# Patient Record
Sex: Female | Born: 1985 | Race: Black or African American | Hispanic: No | Marital: Single | Smoking: Never smoker
Health system: Southern US, Community
[De-identification: ages and names within clinical notes are randomized; demographics above are authoritative.]

## PROBLEM LIST (undated history)

## (undated) DIAGNOSIS — Z8759 Personal history of other complications of pregnancy, childbirth and the puerperium: Secondary | ICD-10-CM

## (undated) DIAGNOSIS — H547 Unspecified visual loss: Secondary | ICD-10-CM

## (undated) DIAGNOSIS — Z8632 Personal history of gestational diabetes: Secondary | ICD-10-CM

## (undated) DIAGNOSIS — T8859XA Other complications of anesthesia, initial encounter: Secondary | ICD-10-CM

## (undated) DIAGNOSIS — T4145XA Adverse effect of unspecified anesthetic, initial encounter: Secondary | ICD-10-CM

## (undated) DIAGNOSIS — D573 Sickle-cell trait: Secondary | ICD-10-CM

## (undated) HISTORY — DX: Sickle-cell trait: D57.3

## (undated) HISTORY — DX: Personal history of gestational diabetes: Z86.32

## (undated) HISTORY — DX: Unspecified visual loss: H54.7

## (undated) HISTORY — DX: Personal history of other complications of pregnancy, childbirth and the puerperium: Z87.59

---
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
## 2012-10-04 ENCOUNTER — Encounter (HOSPITAL_COMMUNITY): Payer: Self-pay | Admitting: Nurse Practitioner

## 2012-10-04 ENCOUNTER — Emergency Department (HOSPITAL_COMMUNITY)
Admission: EM | Admit: 2012-10-04 | Discharge: 2012-10-04 | Disposition: A | Discharge status: Home / Self Care | Payer: Worker's Compensation | Attending: Emergency Medicine | Admitting: Emergency Medicine

## 2012-10-04 ENCOUNTER — Emergency Department (HOSPITAL_COMMUNITY): Payer: Worker's Compensation

## 2012-10-04 DIAGNOSIS — W240XXA Contact with lifting devices, not elsewhere classified, initial encounter: Secondary | ICD-10-CM | POA: Insufficient documentation

## 2012-10-04 DIAGNOSIS — S92919A Unspecified fracture of unspecified toe(s), initial encounter for closed fracture: Secondary | ICD-10-CM | POA: Insufficient documentation

## 2012-10-04 DIAGNOSIS — Y9389 Activity, other specified: Secondary | ICD-10-CM | POA: Insufficient documentation

## 2012-10-04 DIAGNOSIS — Z8669 Personal history of other diseases of the nervous system and sense organs: Secondary | ICD-10-CM

## 2012-10-04 DIAGNOSIS — Y9289 Other specified places as the place of occurrence of the external cause: Secondary | ICD-10-CM | POA: Insufficient documentation

## 2012-10-04 DIAGNOSIS — Y990 Civilian activity done for income or pay: Secondary | ICD-10-CM | POA: Insufficient documentation

## 2012-10-04 DIAGNOSIS — S92911A Unspecified fracture of right toe(s), initial encounter for closed fracture: Secondary | ICD-10-CM

## 2012-10-04 MED ORDER — HYDROCODONE-ACETAMINOPHEN 5-325 MG PO TABS
1.0000 | ORAL_TABLET | Freq: Once | ORAL | Status: AC
  Administered 2012-10-04: 1 via ORAL
  Filled 2012-10-04 (×2): qty 1

## 2012-10-04 MED ORDER — HYDROCODONE-ACETAMINOPHEN 5-325 MG PO TABS: 1.0000 | ORAL_TABLET | ORAL | Status: DC | PRN

## 2012-10-04 NOTE — Progress Notes (Signed)
Orthopedic Tech Progress Note Patient Details:  Kim Brewer Dec 12, 1985 [id number]  Ortho Devices Type of Ortho Device: Crutches;Postop shoe/boot Ortho Device/Splint Location: RLE Ortho Device/Splint Interventions: Ordered;Application   Jennye Moccasin 10/04/2012, 6:34 PM

## 2012-10-04 NOTE — ED Notes (Signed)
Pt is awaiting UDS to be complete before being medicated per MAR.

## 2012-10-04 NOTE — ED Notes (Signed)
States a forklift ran over R foot today. States unable to wiggle toes. Pulses intact, skin w/d.

## 2012-10-04 NOTE — ED Notes (Signed)
Ortho paged for crutches and post op boot. 

## 2012-10-04 NOTE — ED Notes (Signed)
Pt denies any questions upon discharge. 

## 2012-10-04 NOTE — ED Provider Notes (Signed)
History    This chart was scribed for non-physician practitioner Elpidio Anis, PA-C working with Celene Kras, MD by Gerlean Ren, ED Scribe. This patient was seen in room TR06C/TR06C and the patient's care was started at 6:03 PM.   CSN: [id number]  Arrival date & time 10/04/12  1619   First MD Initiated Contact with Patient 10/04/12 1736      Chief Complaint  Patient presents with  . Foot Injury     The history is provided by the patient. No language interpreter was used.  Kim Brewer is a 27 y.o. female who presents to the Emergency Department complaining of constant pain over right foot with sudden onset after a forklift ran over the foot at work today.  Pt denies any numbness in the toes.  Pt denies any falls or further injuries as a result.   No known allergies.      History reviewed. No pertinent past medical history.  History reviewed. No pertinent past surgical history.  History reviewed. No pertinent family history.  History  Substance Use Topics  . Smoking status: Not on file  . Smokeless tobacco: Not on file  . Alcohol Use: Not on file    No OB history provided.   Review of Systems  Musculoskeletal:       Positive right foot pain  Skin: Negative for wound.    Allergies  Review of patient's allergies indicates no known allergies.  Home Medications   Current Outpatient Rx  Name  Route  Sig  Dispense  Refill  . Calcium Carbonate-Vitamin D (CALCIUM-VITAMIN D) 500-200 MG-UNIT per tablet   Oral   Take 1 tablet by mouth daily.         . Multiple Vitamin (MULTIVITAMIN WITH MINERALS) TABS   Oral   Take 1 tablet by mouth daily.           BP 145/96  Pulse 102  Temp(Src) 99 F (37.2 C) (Oral)  Resp 22  SpO2 98%  LMP 09/20/2012  Physical Exam  Nursing note and vitals reviewed. Constitutional: She is oriented to person, place, and time. She appears well-developed and well-nourished. No distress.  HENT:  Head: Normocephalic and atraumatic.   Eyes: EOM are normal.  Neck: Neck supple. No tracheal deviation present.  Cardiovascular: Normal rate.   Pulmonary/Chest: Effort normal. No respiratory distress.  Musculoskeletal: Normal range of motion.  Neurological: She is alert and oriented to person, place, and time.  Skin: Skin is warm and dry.  Psychiatric: She has a normal mood and affect. Her behavior is normal.    ED Course  Procedures (including critical care time) DIAGNOSTIC STUDIES: Oxygen Saturation is 98% on room air, normal by my interpretation.    COORDINATION OF CARE: 6:06 PM- Informed pt of fracture found in XR but that surgery is not needed.  Discussed post-op boot, ibuprofen at work, and stronger pain medicine to be used when safe.  Pt verbalizes understanding and agrees with plan.     Dg Foot Complete Right  10/04/2012  *RADIOLOGY REPORT*  Clinical Data: Crush injury, ran over by forklift  RIGHT FOOT COMPLETE - 3+ VIEW  Comparison: None.  Findings: New acute nondisplaced fractures through the tuft of the distal phalanx of the great toe and second toe.  No additional fractures or malalignment identified.  There is diffuse soft tissue swelling over the dorsum of the foot.  IMPRESSION:  Acute nondisplaced fractures of the tufts of the distal phalanges of the great and second  toes.   Original Report Authenticated By: Malachy Moan, M.D.      No diagnosis found.  1. Toe fractures 2. History of blindness   MDM  Discussed with family/friend/co-worker at bedside that this blind patient would need someone to help her with ambulation as long as she needed to use crutches as she would be unable to use her sight cane. Patient and friends acknowledge and understand.      I personally performed the services described in this documentation, which was scribed in my presence. The recorded information has been reviewed and is accurate.     Arnoldo Hooker, PA-C 10/04/12 1829

## 2012-10-04 NOTE — ED Provider Notes (Signed)
Medical screening examination/treatment/procedure(s) were performed by non-physician practitioner and as supervising physician I was immediately available for consultation/collaboration.   Ivionna Verley R Sakura Denis, MD 10/04/12 1837 

## 2012-10-04 NOTE — ED Notes (Signed)
Lab at bedside for testing

## 2013-03-01 ENCOUNTER — Ambulatory Visit (INDEPENDENT_AMBULATORY_CARE_PROVIDER_SITE_OTHER): Payer: BC Managed Care – PPO | Admitting: Emergency Medicine

## 2013-03-01 VITALS — BP 122/88 | HR 87 | Temp 99.6°F | Resp 36 | Ht 62.0 in | Wt 198.0 lb

## 2013-03-01 DIAGNOSIS — R079 Chest pain, unspecified: Secondary | ICD-10-CM

## 2013-03-01 DIAGNOSIS — K2970 Gastritis, unspecified, without bleeding: Secondary | ICD-10-CM

## 2013-03-01 MED ORDER — ESOMEPRAZOLE MAGNESIUM 40 MG PO CPDR: 40.0000 mg | DELAYED_RELEASE_CAPSULE | Freq: Every day | ORAL | Status: DC

## 2013-03-01 MED ORDER — GI COCKTAIL ~~LOC~~: 30.0000 mL | Freq: Once | ORAL | Status: DC

## 2013-03-01 MED ORDER — GI COCKTAIL ~~LOC~~
30.0000 mL | Freq: Once | ORAL | Status: AC
  Administered 2013-03-01: 30 mL via ORAL

## 2013-03-01 NOTE — Progress Notes (Signed)
[facility] [address] [place] [address] [phone]  Date:  03/01/2013   Name:  Kim Brewer   DOB:  1986-01-22   MRN:  [id number]  PCP:  No PCP Per Patient    Chief Complaint: Shortness of Breath and Abdominal Pain   History of Present Illness:  Kim Brewer is a 27 y.o. very pleasant female patient who presents with the following:  Thinks she is allergic to mushrooms.  Her sister made spaghetti with mushrooms.  While eating it she says her nose began to burn and then she had a pain in her epigastrium that was lancinating and then she developed a pressure like pain in her mid chest.  She denies nausea or vomiting.  No shortness of breath or wheezing or cough.  Has no food intolerance usually.  Has no symptoms suggestive of reflux.  No fever or chills.  No nausea or vomiting.  No stool change.  Pain not radiating.  Non smoker,  No medical problems.  Denies other complaint or health concern today.   There are no active problems to display for this patient.   Past Medical History  Diagnosis Date  . Blind     No past surgical history on file.  History  Substance Use Topics  . Smoking status: Never Smoker   . Smokeless tobacco: Not on file  . Alcohol Use: Not on file    No family history on file.  No Known Allergies  Medication list has been reviewed and updated.  Current Outpatient Prescriptions on File Prior to Visit  Medication Sig Dispense Refill  . Calcium Carbonate-Vitamin D (CALCIUM-VITAMIN D) 500-200 MG-UNIT per tablet Take 1 tablet by mouth daily.      . Multiple Vitamin (MULTIVITAMIN WITH MINERALS) TABS Take 1 tablet by mouth daily.       No current facility-administered medications on file prior to visit.    Review of Systems:  As per HPI, otherwise negative.    Physical Examination: Filed Vitals:   03/01/13 1447  BP: 122/88  Pulse: 87  Temp: 99.6 F (37.6 C)  Resp: 36   Filed Vitals:   03/01/13 1447  Height: 5'  2" (1.575 m)  Weight: 198 lb (89.812 kg)   Body mass index is 36.21 kg/(m^2). Ideal Body Weight: Weight in (lb) to have BMI = 25: 136.4  GEN: WDWN, NAD, Non-toxic, A & O x 3 HEENT: Atraumatic, Normocephalic. Neck supple. No masses, No LAD. Ears and Nose: No external deformity. CV: RRR, No M/G/R. No JVD. No thrill. No extra heart sounds. PULM: CTA B, no wheezes, crackles, rhonchi. No retractions. No resp. distress. No accessory muscle use. ABD: S, NT, ND, +BS. No rebound. No HSM. EXTR: No c/c/e NEURO Normal gait.  PSYCH: Normally interactive. Conversant. Not depressed or anxious appearing.  Calm demeanor.    Assessment and Plan: Gastritis Chest pain nexium   Signed,  Phillips Odor, MD

## 2013-03-01 NOTE — Patient Instructions (Signed)

## 2013-04-28 ENCOUNTER — Other Ambulatory Visit: Payer: Self-pay | Admitting: Occupational Medicine

## 2013-04-28 ENCOUNTER — Ambulatory Visit: Payer: Self-pay

## 2013-04-28 DIAGNOSIS — R52 Pain, unspecified: Secondary | ICD-10-CM

## 2013-05-19 ENCOUNTER — Ambulatory Visit (INDEPENDENT_AMBULATORY_CARE_PROVIDER_SITE_OTHER): Payer: BC Managed Care – PPO | Admitting: Family Medicine

## 2013-05-19 VITALS — BP 110/78 | HR 72 | Temp 98.0°F | Resp 16 | Ht 63.0 in | Wt 200.0 lb

## 2013-05-19 DIAGNOSIS — H6120 Impacted cerumen, unspecified ear: Secondary | ICD-10-CM

## 2013-05-19 DIAGNOSIS — H6123 Impacted cerumen, bilateral: Secondary | ICD-10-CM

## 2013-05-19 DIAGNOSIS — H543 Unqualified visual loss, both eyes: Secondary | ICD-10-CM

## 2013-05-19 DIAGNOSIS — H6993 Unspecified Eustachian tube disorder, bilateral: Secondary | ICD-10-CM

## 2013-05-19 DIAGNOSIS — H9201 Otalgia, right ear: Secondary | ICD-10-CM

## 2013-05-19 DIAGNOSIS — H6191 Disorder of right external ear, unspecified: Secondary | ICD-10-CM

## 2013-05-19 DIAGNOSIS — H9209 Otalgia, unspecified ear: Secondary | ICD-10-CM

## 2013-05-19 DIAGNOSIS — H6983 Other specified disorders of Eustachian tube, bilateral: Secondary | ICD-10-CM

## 2013-05-19 DIAGNOSIS — H6190 Disorder of external ear, unspecified, unspecified ear: Secondary | ICD-10-CM

## 2013-05-19 MED ORDER — TRAMADOL HCL 50 MG PO TABS: 50.0000 mg | ORAL_TABLET | Freq: Three times a day (TID) | ORAL | Status: DC | PRN

## 2013-05-19 MED ORDER — DOXYCYCLINE HYCLATE 100 MG PO CAPS: 100.0000 mg | ORAL_CAPSULE | Freq: Two times a day (BID) | ORAL | Status: DC

## 2013-05-19 MED ORDER — FLUTICASONE PROPIONATE 50 MCG/ACT NA SUSP: NASAL | Status: DC

## 2013-05-19 NOTE — Patient Instructions (Addendum)
Take doxycycline one twice daily for infection of the lesion in the ear  Referral will be made for an ear nose and throat doctor to take the place out of your ear.  Takes the tramadol one every 6 or 8 hours if needed for severe pain  Use the nose spray, fluticasone, 2 sprays in each nostril twice daily for 4 days, then once daily

## 2013-05-19 NOTE — Progress Notes (Signed)
Subjective

## 2016-06-15 NOTE — L&D Delivery Note (Signed)
Delivery Note At 12:54 PM a viable female was delivered via Vaginal, Spontaneous (Presentation: OA with compound right arm).  APGAR: 8, 9; weight  pending.  Placenta status: delivered intact via schultz.  Cord: 3V  with the following complications: PPH.  Cord pH: n/a  Brisk bleeding after delivery of placenta. Boggy LUS. No improvement with fundal massage. Pitocin infusing. Cytotec 800 mcg pr given and bleeding resolved.   Anesthesia: epidural and local   Episiotomy: None Lacerations: 2nd degree, right labial Suture Repair: 2.0 3.0 vicryl rapide Est. Blood Loss (mL): 1050  Mom to postpartum.  Baby to Couplet care / Skin to Skin.  Donette LarryMelanie Tyresha Fede, CNM 06/12/2017, 1:38 PM

## 2017-01-05 ENCOUNTER — Encounter: Payer: Self-pay | Admitting: Obstetrics and Gynecology

## 2017-01-06 ENCOUNTER — Encounter: Payer: Self-pay | Admitting: Certified Nurse Midwife

## 2017-01-06 ENCOUNTER — Ambulatory Visit (INDEPENDENT_AMBULATORY_CARE_PROVIDER_SITE_OTHER): Payer: BLUE CROSS/BLUE SHIELD | Admitting: Certified Nurse Midwife

## 2017-01-06 ENCOUNTER — Encounter: Payer: Self-pay | Admitting: *Deleted

## 2017-01-06 VITALS — BP 128/86 | HR 85 | Wt 188.0 lb

## 2017-01-06 DIAGNOSIS — O0990 Supervision of high risk pregnancy, unspecified, unspecified trimester: Secondary | ICD-10-CM | POA: Insufficient documentation

## 2017-01-06 DIAGNOSIS — Z124 Encounter for screening for malignant neoplasm of cervix: Secondary | ICD-10-CM | POA: Diagnosis not present

## 2017-01-06 DIAGNOSIS — Z3402 Encounter for supervision of normal first pregnancy, second trimester: Secondary | ICD-10-CM

## 2017-01-06 DIAGNOSIS — Z113 Encounter for screening for infections with a predominantly sexual mode of transmission: Secondary | ICD-10-CM | POA: Diagnosis not present

## 2017-01-06 DIAGNOSIS — D573 Sickle-cell trait: Secondary | ICD-10-CM

## 2017-01-06 DIAGNOSIS — Z3400 Encounter for supervision of normal first pregnancy, unspecified trimester: Secondary | ICD-10-CM

## 2017-01-06 NOTE — Progress Notes (Signed)
Pt states she is having some pelvic pressure.  Pt states she has had Migraines early in pregnancy.
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
## 2017-01-08 ENCOUNTER — Other Ambulatory Visit: Payer: Self-pay | Admitting: Certified Nurse Midwife

## 2017-01-08 LAB — PRENATAL PROFILE I(LABCORP)
ANTIBODY SCREEN: NEGATIVE
BASOS: 0 %
Basophils Absolute: 0 10*3/uL (ref 0.0–0.2)
EOS (ABSOLUTE): 0.3 10*3/uL (ref 0.0–0.4)
Eos: 4 %
HEMATOCRIT: 36.2 % (ref 34.0–46.6)
Hemoglobin: 12 g/dL (ref 11.1–15.9)
Hepatitis B Surface Ag: NEGATIVE
Immature Grans (Abs): 0 10*3/uL (ref 0.0–0.1)
Immature Granulocytes: 1 %
LYMPHS ABS: 1.9 10*3/uL (ref 0.7–3.1)
Lymphs: 24 %
MCH: 27.4 pg (ref 26.6–33.0)
MCHC: 33.1 g/dL (ref 31.5–35.7)
MCV: 83 fL (ref 79–97)
MONOS ABS: 0.4 10*3/uL (ref 0.1–0.9)
Monocytes: 5 %
NEUTROS ABS: 5.2 10*3/uL (ref 1.4–7.0)
Neutrophils: 66 %
Platelets: 267 10*3/uL (ref 150–379)
RBC: 4.38 x10E6/uL (ref 3.77–5.28)
RDW: 13.6 % (ref 12.3–15.4)
RPR Ser Ql: NONREACTIVE
RUBELLA: 1.71 {index} (ref >0.99)
Rh Factor: POSITIVE
WBC: 7.8 10*3/uL (ref 3.4–10.8)

## 2017-01-08 LAB — HEMOGLOBINOPATHY EVALUATION
HEMOGLOBIN A2 QUANTITATION: 3.8 % — AB (ref 1.8–3.2)
HEMOGLOBIN F QUANTITATION: 0 % (ref 0.0–2.0)
HGB C: 0 %
HGB S: 33.7 % — AB
HGB VARIANT: 0 %
Hgb A: 62.5 % — ABNORMAL LOW (ref 96.4–98.8)

## 2017-01-08 LAB — CYTOLOGY - PAP: DIAGNOSIS: NEGATIVE

## 2017-01-08 LAB — CERVICOVAGINAL ANCILLARY ONLY
Bacterial vaginitis: POSITIVE — AB
Candida vaginitis: NEGATIVE
Chlamydia: NEGATIVE
Neisseria Gonorrhea: NEGATIVE
TRICH (WINDOWPATH): NEGATIVE

## 2017-01-08 LAB — VARICELLA ZOSTER ANTIBODY, IGG: Varicella zoster IgG: 948 index (ref >165)

## 2017-01-08 LAB — HEMOGLOBIN A1C
Est. average glucose Bld gHb Est-mCnc: 103 mg/dL
Hgb A1c MFr Bld: 5.2 % (ref 4.8–5.6)

## 2017-01-08 LAB — VITAMIN D 25 HYDROXY (VIT D DEFICIENCY, FRACTURES): Vit D, 25-Hydroxy: 38.9 ng/mL (ref 30.0–100.0)

## 2017-01-11 ENCOUNTER — Telehealth: Payer: Self-pay

## 2017-01-11 DIAGNOSIS — B9689 Other specified bacterial agents as the cause of diseases classified elsewhere: Secondary | ICD-10-CM

## 2017-01-11 DIAGNOSIS — N760 Acute vaginitis: Principal | ICD-10-CM

## 2017-01-11 MED ORDER — METRONIDAZOLE 500 MG PO TABS: 500.0000 mg | ORAL_TABLET | Freq: Two times a day (BID) | ORAL | 0 refills | Status: AC

## 2017-01-11 NOTE — Telephone Encounter (Signed)
Pt called wanting to know lab results, and blood type. Labs reviewed with patient. Rx has been sent to the pharmacy

## 2017-01-12 ENCOUNTER — Other Ambulatory Visit: Payer: Self-pay | Admitting: Certified Nurse Midwife

## 2017-01-12 DIAGNOSIS — Z3400 Encounter for supervision of normal first pregnancy, unspecified trimester: Secondary | ICD-10-CM

## 2017-02-04 ENCOUNTER — Other Ambulatory Visit: Payer: Self-pay | Admitting: Certified Nurse Midwife

## 2017-02-04 ENCOUNTER — Ambulatory Visit (INDEPENDENT_AMBULATORY_CARE_PROVIDER_SITE_OTHER): Payer: BLUE CROSS/BLUE SHIELD | Admitting: Certified Nurse Midwife

## 2017-02-04 ENCOUNTER — Ambulatory Visit (HOSPITAL_COMMUNITY)
Admission: RE | Admit: 2017-02-04 | Discharge: 2017-02-04 | Disposition: A | Discharge status: Home / Self Care | Payer: BLUE CROSS/BLUE SHIELD | Source: Ambulatory Visit | Attending: Certified Nurse Midwife | Admitting: Certified Nurse Midwife

## 2017-02-04 VITALS — BP 129/80 | HR 80 | Wt 189.2 lb

## 2017-02-04 DIAGNOSIS — Z363 Encounter for antenatal screening for malformations: Secondary | ICD-10-CM | POA: Insufficient documentation

## 2017-02-04 DIAGNOSIS — Z3400 Encounter for supervision of normal first pregnancy, unspecified trimester: Secondary | ICD-10-CM

## 2017-02-04 DIAGNOSIS — Z3A19 19 weeks gestation of pregnancy: Secondary | ICD-10-CM | POA: Insufficient documentation

## 2017-02-04 DIAGNOSIS — Z3402 Encounter for supervision of normal first pregnancy, second trimester: Secondary | ICD-10-CM

## 2017-02-04 DIAGNOSIS — Z62810 Personal history of physical and sexual abuse in childhood: Secondary | ICD-10-CM

## 2017-02-04 DIAGNOSIS — O99212 Obesity complicating pregnancy, second trimester: Secondary | ICD-10-CM | POA: Insufficient documentation
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
## 2017-02-04 NOTE — Progress Notes (Signed)
Patient reports good fetal movement, denies pain. 

## 2017-02-06 LAB — AFP TETRA
DIA Mom Value: 1.63
DIA Value (EIA): 252.36 pg/mL
DSR (BY AGE) 1 IN: 757
DSR (SECOND TRIMESTER) 1 IN: 10000
GESTATIONAL AGE AFP: 19 wk
MSAFP Mom: 1.8
MSAFP: 83.6 ng/mL
MSHCG Mom: 1.35
MSHCG: 29102 m[IU]/mL
Maternal Age At EDD: 29.3 yr
Osb Risk: 2555
TEST RESULTS AFP: NEGATIVE
WEIGHT: 189 [lb_av]
uE3 Mom: 1.68
uE3 Value: 2.42 ng/mL

## 2017-02-09 ENCOUNTER — Other Ambulatory Visit: Payer: Self-pay | Admitting: Certified Nurse Midwife

## 2017-02-09 DIAGNOSIS — Z3400 Encounter for supervision of normal first pregnancy, unspecified trimester: Secondary | ICD-10-CM

## 2017-03-04 ENCOUNTER — Encounter: Payer: Self-pay | Admitting: Certified Nurse Midwife

## 2017-03-04 ENCOUNTER — Ambulatory Visit (INDEPENDENT_AMBULATORY_CARE_PROVIDER_SITE_OTHER): Payer: BLUE CROSS/BLUE SHIELD | Admitting: Certified Nurse Midwife

## 2017-03-04 VITALS — BP 129/79 | HR 77 | Wt 194.7 lb

## 2017-03-04 DIAGNOSIS — Z62810 Personal history of physical and sexual abuse in childhood: Secondary | ICD-10-CM

## 2017-03-04 DIAGNOSIS — Z3402 Encounter for supervision of normal first pregnancy, second trimester: Secondary | ICD-10-CM

## 2017-03-04 DIAGNOSIS — H543 Unqualified visual loss, both eyes: Secondary | ICD-10-CM

## 2017-03-04 DIAGNOSIS — Z3400 Encounter for supervision of normal first pregnancy, unspecified trimester: Secondary | ICD-10-CM
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
## 2017-03-04 NOTE — Progress Notes (Signed)
Patient is unable to sleep.

## 2017-03-22 ENCOUNTER — Ambulatory Visit (HOSPITAL_COMMUNITY)
Admission: RE | Admit: 2017-03-22 | Discharge: 2017-03-22 | Disposition: A | Discharge status: Home / Self Care | Payer: BLUE CROSS/BLUE SHIELD | Source: Ambulatory Visit | Attending: Certified Nurse Midwife | Admitting: Certified Nurse Midwife

## 2017-03-22 DIAGNOSIS — Z3A25 25 weeks gestation of pregnancy: Secondary | ICD-10-CM | POA: Insufficient documentation

## 2017-03-22 DIAGNOSIS — Z362 Encounter for other antenatal screening follow-up: Secondary | ICD-10-CM | POA: Insufficient documentation

## 2017-03-22 DIAGNOSIS — O99212 Obesity complicating pregnancy, second trimester: Secondary | ICD-10-CM | POA: Insufficient documentation

## 2017-03-22 DIAGNOSIS — Z3400 Encounter for supervision of normal first pregnancy, unspecified trimester: Secondary | ICD-10-CM

## 2017-03-23 ENCOUNTER — Other Ambulatory Visit: Payer: Self-pay | Admitting: Certified Nurse Midwife

## 2017-03-23 DIAGNOSIS — Z3400 Encounter for supervision of normal first pregnancy, unspecified trimester: Secondary | ICD-10-CM

## 2017-04-01 ENCOUNTER — Encounter: Payer: Self-pay | Admitting: Certified Nurse Midwife

## 2017-04-01 ENCOUNTER — Other Ambulatory Visit: Payer: BLUE CROSS/BLUE SHIELD

## 2017-04-01 ENCOUNTER — Ambulatory Visit (INDEPENDENT_AMBULATORY_CARE_PROVIDER_SITE_OTHER): Payer: BLUE CROSS/BLUE SHIELD | Admitting: Certified Nurse Midwife

## 2017-04-01 VITALS — BP 132/82 | HR 92 | Wt 194.0 lb

## 2017-04-01 DIAGNOSIS — H543 Unqualified visual loss, both eyes: Secondary | ICD-10-CM

## 2017-04-01 DIAGNOSIS — Z3402 Encounter for supervision of normal first pregnancy, second trimester: Secondary | ICD-10-CM

## 2017-04-01 DIAGNOSIS — Z3400 Encounter for supervision of normal first pregnancy, unspecified trimester: Secondary | ICD-10-CM

## 2017-04-01 NOTE — Progress Notes (Signed)
Patient reports good fetal movement and reports often having a lot of pressure when she stands making it hard for her to walk.

## 2017-04-01 NOTE — Patient Instructions (Signed)
AREA PEDIATRIC/FAMILY PRACTICE PHYSICIANS  [facility] [address] [place], [place]  [address] Phone - [phone]   Fax - [phone]  [facility] [address] [place], [place] [address] Phone - [phone]   Fax - [phone]  [facility] [address] [place], [place]  [address] Phone - [phone]   Fax - [phone]  [facility] [address] [place], [place]  [address] Phone - [phone]   Fax - [phone]  [facility] [address] [place], [place]  [address] Phone - [phone]   Fax - [phone]  [facility] [address] [place], [place]  [address] Phone - [phone]   Fax - [phone]  [facility] [address] [place], [place]  [address] Phone - [phone]   Fax - [phone]  [facility] [address] [place], [place]  [address] Phone - [phone]   Fax - [phone]  [facility] [address] [place], [place]  [address] Phone - [phone]   Fax - [phone] [facility] [address] [place], [place]  [address] Phone - [phone]   Fax - [phone]  [facility] [address] [place], [place]  [address] Phone - [phone]   Fax - [phone]  [facility] [address] [place], [place]  [address] Phone - [phone]   Fax - [phone]  [facility] [address], [address] [place], [place]  [address] Phone - [phone]   Fax - [phone]  [facility] [address] [place], [place]  [address] Phone - [phone]  [facility] [address] [place], [place]  [address] Phone - [phone]   Fax - [phone]  [facility] [address] [place], [place]  [address] Phone - [phone]   Fax - [phone]  [facility] [address] [place], [place]  [address] Phone - [phone]   Fax - [phone]  [facility] [address] [place], [place]  [address] Phone - [phone]   Fax - [phone]   [facility] [address] [place], [place]  [address] Phone - [phone]   Fax - [phone]  [facility] [address] [place], [place]  [address] Phone - [phone]   Fax - [phone]  [facility] [address] [place], [place]  [address] Phone - [phone]   Fax - [phone]  [facility] [address] [place], [place]  [address] Phone - [phone]   Fax - [phone]  [facility] [address] , [place]  [address] Phone - [phone]   Fax - [phone] [facility] [address] [place], [place]  [address] Phone - [phone]   Fax - [phone]  [facility] [address] [place], [place]  [address] Phone - [phone]   Fax - [phone]  [facility] [address] ,   [address] Phone - [phone]   Fax - [phone]  [facility] Charlene Flemming MD [address] [place]  [address] Phone [phone]  Fax [phone]   
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
## 2017-04-02 ENCOUNTER — Telehealth: Payer: Self-pay

## 2017-04-02 ENCOUNTER — Other Ambulatory Visit: Payer: Self-pay | Admitting: Certified Nurse Midwife

## 2017-04-02 ENCOUNTER — Other Ambulatory Visit: Payer: Self-pay

## 2017-04-02 DIAGNOSIS — Z8632 Personal history of gestational diabetes: Secondary | ICD-10-CM | POA: Insufficient documentation

## 2017-04-02 DIAGNOSIS — O0990 Supervision of high risk pregnancy, unspecified, unspecified trimester: Secondary | ICD-10-CM

## 2017-04-02 DIAGNOSIS — O24410 Gestational diabetes mellitus in pregnancy, diet controlled: Secondary | ICD-10-CM

## 2017-04-02 HISTORY — DX: Personal history of gestational diabetes: Z86.32

## 2017-04-02 LAB — CBC
Hematocrit: 35.6 % (ref 34.0–46.6)
Hemoglobin: 11.5 g/dL (ref 11.1–15.9)
MCH: 27.4 pg (ref 26.6–33.0)
MCHC: 32.3 g/dL (ref 31.5–35.7)
MCV: 85 fL (ref 79–97)
PLATELETS: 245 10*3/uL (ref 150–379)
RBC: 4.19 x10E6/uL (ref 3.77–5.28)
RDW: 13.9 % (ref 12.3–15.4)
WBC: 9.2 10*3/uL (ref 3.4–10.8)

## 2017-04-02 LAB — GLUCOSE TOLERANCE, 2 HOURS W/ 1HR
GLUCOSE, FASTING: 85 mg/dL (ref 65–91)
Glucose, 1 hour: 177 mg/dL (ref 65–179)
Glucose, 2 hour: 204 mg/dL — ABNORMAL HIGH (ref 65–152)

## 2017-04-02 LAB — RPR: RPR: NONREACTIVE

## 2017-04-02 LAB — HIV ANTIBODY (ROUTINE TESTING W REFLEX): HIV Screen 4th Generation wRfx: NONREACTIVE

## 2017-04-02 MED ORDER — GLUCOSE BLOOD VI STRP: ORAL_STRIP | 12 refills | Status: DC

## 2017-04-02 MED ORDER — ACCU-CHEK GUIDE W/DEVICE KIT: 1.0000 | PACK | Freq: Once | 0 refills | Status: AC

## 2017-04-02 MED ORDER — ACCU-CHEK FASTCLIX LANCETS MISC
1.0000 | Freq: Four times a day (QID) | 12 refills | Status: DC
Start: 2017-04-02 — End: 2017-04-02

## 2017-04-02 MED ORDER — ACCU-CHEK GUIDE W/DEVICE KIT: 1.0000 | PACK | Freq: Once | 0 refills | Status: DC

## 2017-04-02 MED ORDER — ACCU-CHEK FASTCLIX LANCETS MISC: 1.0000 | Freq: Four times a day (QID) | 12 refills | Status: AC

## 2017-04-02 NOTE — Telephone Encounter (Signed)
Patient notified of results and Rx. 

## 2017-04-02 NOTE — Telephone Encounter (Signed)
-----   Message from Roe Coombsachelle A Denney, CNM sent at 04/02/2017 10:49 AM EDT ----- Please let her know that she did not pass her glucose testing.  Please order her glucometer, test strips and lancets.  MFM referral orders have been placed.  Thank you.  R.Denney CNM

## 2017-04-08 ENCOUNTER — Telehealth: Payer: Self-pay

## 2017-04-08 NOTE — Telephone Encounter (Signed)
Informed pt [employer] will not cover [employer]. The one device they will cover is Contour. Pt is visually disabled and is requesting a voice activated monitor. Pt directed to [employer] for voice activated options. Pt states she is on [employer] as we speak. Reminded pt of upcoming N&D visit on Weds. Pt agrees and has no further questions.

## 2017-04-14 ENCOUNTER — Ambulatory Visit: Payer: Self-pay

## 2017-04-14 ENCOUNTER — Encounter: Payer: BLUE CROSS/BLUE SHIELD | Attending: Certified Nurse Midwife | Admitting: Registered"

## 2017-04-14 DIAGNOSIS — Z713 Dietary counseling and surveillance: Secondary | ICD-10-CM | POA: Insufficient documentation

## 2017-04-14 DIAGNOSIS — O24410 Gestational diabetes mellitus in pregnancy, diet controlled: Secondary | ICD-10-CM | POA: Insufficient documentation

## 2017-04-14 DIAGNOSIS — O0993 Supervision of high risk pregnancy, unspecified, third trimester: Secondary | ICD-10-CM | POA: Diagnosis not present

## 2017-04-14 DIAGNOSIS — R7309 Other abnormal glucose: Secondary | ICD-10-CM

## 2017-04-14 NOTE — Progress Notes (Signed)
Patient was seen on 04/14/17 for Gestational Diabetes self-management class at the [facility]. The following learning objectives were met by the patient during this course:   States the definition of Gestational Diabetes  States why dietary management is important in controlling blood glucose  Describes the effects each nutrient has on blood glucose levels  Demonstrates ability to create a balanced meal plan  Demonstrates carbohydrate counting   States when to check blood glucose levels  Demonstrates proper blood glucose monitoring techniques  States the effect of stress and exercise on blood glucose levels  States the importance of limiting caffeine and abstaining from alcohol and smoking  Blood glucose monitor given: [employer] Lot # [id number] X Exp: 07/15/2018 Blood glucose reading: 110  Patient instructed to monitor glucose levels: FBS: 60 - <95 1 hour: <140 2 hour: <120  Patient received handouts:  Nutrition Diabetes and Pregnancy  Carbohydrate Counting List  Patient will be seen for follow-up as needed.

## 2017-04-15 ENCOUNTER — Encounter: Payer: Self-pay | Admitting: Registered"

## 2017-04-15 ENCOUNTER — Ambulatory Visit (INDEPENDENT_AMBULATORY_CARE_PROVIDER_SITE_OTHER): Payer: BLUE CROSS/BLUE SHIELD | Admitting: Certified Nurse Midwife

## 2017-04-15 VITALS — BP 116/78 | HR 83 | Wt 194.6 lb

## 2017-04-15 DIAGNOSIS — O0990 Supervision of high risk pregnancy, unspecified, unspecified trimester: Secondary | ICD-10-CM

## 2017-04-15 DIAGNOSIS — O24410 Gestational diabetes mellitus in pregnancy, diet controlled: Secondary | ICD-10-CM

## 2017-04-15 DIAGNOSIS — Z62810 Personal history of physical and sexual abuse in childhood: Secondary | ICD-10-CM

## 2017-04-15 DIAGNOSIS — H543 Unqualified visual loss, both eyes: Secondary | ICD-10-CM
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
## 2017-04-19 ENCOUNTER — Ambulatory Visit (INDEPENDENT_AMBULATORY_CARE_PROVIDER_SITE_OTHER): Payer: Self-pay | Admitting: Pediatrics

## 2017-04-19 DIAGNOSIS — Z7681 Expectant parent(s) prebirth pediatrician visit: Secondary | ICD-10-CM

## 2017-04-24 NOTE — Progress Notes (Signed)
Prenatal counseling for impending newborn done-- 1st boy, currently 29 wks, no complications, prenatal care started 7wks Z76.81

## 2017-04-29 ENCOUNTER — Ambulatory Visit (INDEPENDENT_AMBULATORY_CARE_PROVIDER_SITE_OTHER): Payer: BLUE CROSS/BLUE SHIELD | Admitting: Obstetrics & Gynecology

## 2017-04-29 ENCOUNTER — Encounter: Payer: Self-pay | Admitting: Obstetrics & Gynecology

## 2017-04-29 VITALS — BP 121/79 | HR 87 | Wt 198.0 lb

## 2017-04-29 DIAGNOSIS — O0990 Supervision of high risk pregnancy, unspecified, unspecified trimester: Secondary | ICD-10-CM

## 2017-04-29 DIAGNOSIS — O0993 Supervision of high risk pregnancy, unspecified, third trimester: Secondary | ICD-10-CM

## 2017-04-29 DIAGNOSIS — O24410 Gestational diabetes mellitus in pregnancy, diet controlled: Secondary | ICD-10-CM
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
## 2017-04-29 NOTE — Patient Instructions (Signed)
Return to clinic for any scheduled appointments or obstetric concerns, or go to MAU for evaluation  

## 2017-05-13 ENCOUNTER — Encounter: Payer: Self-pay | Admitting: Certified Nurse Midwife

## 2017-05-13 ENCOUNTER — Ambulatory Visit (INDEPENDENT_AMBULATORY_CARE_PROVIDER_SITE_OTHER): Payer: BLUE CROSS/BLUE SHIELD | Admitting: Certified Nurse Midwife

## 2017-05-13 VITALS — BP 134/86 | HR 83 | Wt 205.0 lb

## 2017-05-13 DIAGNOSIS — O0990 Supervision of high risk pregnancy, unspecified, unspecified trimester: Secondary | ICD-10-CM

## 2017-05-13 DIAGNOSIS — H543 Unqualified visual loss, both eyes: Secondary | ICD-10-CM

## 2017-05-13 DIAGNOSIS — Z23 Encounter for immunization: Secondary | ICD-10-CM

## 2017-05-13 DIAGNOSIS — O0993 Supervision of high risk pregnancy, unspecified, third trimester: Secondary | ICD-10-CM

## 2017-05-13 DIAGNOSIS — O24410 Gestational diabetes mellitus in pregnancy, diet controlled: Secondary | ICD-10-CM
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
## 2017-05-26 ENCOUNTER — Ambulatory Visit (INDEPENDENT_AMBULATORY_CARE_PROVIDER_SITE_OTHER): Payer: BLUE CROSS/BLUE SHIELD | Admitting: Obstetrics and Gynecology

## 2017-05-26 ENCOUNTER — Encounter: Payer: Self-pay | Admitting: Obstetrics and Gynecology

## 2017-05-26 VITALS — BP 136/86 | HR 82 | Wt 202.6 lb

## 2017-05-26 DIAGNOSIS — Z113 Encounter for screening for infections with a predominantly sexual mode of transmission: Secondary | ICD-10-CM | POA: Diagnosis not present

## 2017-05-26 DIAGNOSIS — O24410 Gestational diabetes mellitus in pregnancy, diet controlled: Secondary | ICD-10-CM | POA: Diagnosis not present

## 2017-05-26 DIAGNOSIS — O0990 Supervision of high risk pregnancy, unspecified, unspecified trimester: Secondary | ICD-10-CM

## 2017-05-26 LAB — OB RESULTS CONSOLE GBS: GBS: NEGATIVE
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
## 2017-05-27 LAB — CERVICOVAGINAL ANCILLARY ONLY
Bacterial vaginitis: NEGATIVE
Candida vaginitis: NEGATIVE
Chlamydia: NEGATIVE
Neisseria Gonorrhea: NEGATIVE
Trichomonas: NEGATIVE

## 2017-05-28 LAB — STREP GP B NAA: Strep Gp B NAA: NEGATIVE

## 2017-06-02 ENCOUNTER — Encounter: Payer: Self-pay | Admitting: Obstetrics and Gynecology

## 2017-06-02 ENCOUNTER — Ambulatory Visit (INDEPENDENT_AMBULATORY_CARE_PROVIDER_SITE_OTHER): Payer: BLUE CROSS/BLUE SHIELD | Admitting: Obstetrics and Gynecology

## 2017-06-02 VITALS — BP 136/87 | HR 90 | Wt 210.0 lb

## 2017-06-02 DIAGNOSIS — O0993 Supervision of high risk pregnancy, unspecified, third trimester: Secondary | ICD-10-CM

## 2017-06-02 DIAGNOSIS — O24410 Gestational diabetes mellitus in pregnancy, diet controlled: Secondary | ICD-10-CM | POA: Diagnosis not present

## 2017-06-02 DIAGNOSIS — O0990 Supervision of high risk pregnancy, unspecified, unspecified trimester: Secondary | ICD-10-CM

## 2017-06-02 LAB — GLUCOSE, POCT (MANUAL RESULT ENTRY): POC GLUCOSE: 113 mg/dL — AB (ref 70–99)
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
## 2017-06-11 ENCOUNTER — Ambulatory Visit (HOSPITAL_COMMUNITY): Payer: Self-pay

## 2017-06-11 ENCOUNTER — Encounter (HOSPITAL_COMMUNITY): Payer: Self-pay

## 2017-06-11 ENCOUNTER — Ambulatory Visit (INDEPENDENT_AMBULATORY_CARE_PROVIDER_SITE_OTHER): Payer: BLUE CROSS/BLUE SHIELD | Admitting: Obstetrics

## 2017-06-11 ENCOUNTER — Inpatient Hospital Stay (HOSPITAL_COMMUNITY)
Admission: AD | Admit: 2017-06-11 | Discharge: 2017-06-16 | DRG: 807 | Disposition: A | Discharge status: Home / Self Care | Payer: BLUE CROSS/BLUE SHIELD | Source: Ambulatory Visit | Attending: Obstetrics and Gynecology | Admitting: Obstetrics and Gynecology

## 2017-06-11 DIAGNOSIS — Z8632 Personal history of gestational diabetes: Secondary | ICD-10-CM | POA: Diagnosis present

## 2017-06-11 DIAGNOSIS — O9902 Anemia complicating childbirth: Secondary | ICD-10-CM | POA: Diagnosis present

## 2017-06-11 DIAGNOSIS — O99214 Obesity complicating childbirth: Secondary | ICD-10-CM | POA: Diagnosis present

## 2017-06-11 DIAGNOSIS — Z8759 Personal history of other complications of pregnancy, childbirth and the puerperium: Secondary | ICD-10-CM | POA: Diagnosis present

## 2017-06-11 DIAGNOSIS — O1414 Severe pre-eclampsia complicating childbirth: Principal | ICD-10-CM | POA: Diagnosis present

## 2017-06-11 DIAGNOSIS — Z3A37 37 weeks gestation of pregnancy: Secondary | ICD-10-CM

## 2017-06-11 DIAGNOSIS — H543 Unqualified visual loss, both eyes: Secondary | ICD-10-CM | POA: Diagnosis present

## 2017-06-11 DIAGNOSIS — O1493 Unspecified pre-eclampsia, third trimester: Secondary | ICD-10-CM

## 2017-06-11 DIAGNOSIS — O24420 Gestational diabetes mellitus in childbirth, diet controlled: Secondary | ICD-10-CM | POA: Diagnosis present

## 2017-06-11 DIAGNOSIS — R7989 Other specified abnormal findings of blood chemistry: Secondary | ICD-10-CM | POA: Diagnosis present

## 2017-06-11 DIAGNOSIS — O0990 Supervision of high risk pregnancy, unspecified, unspecified trimester: Secondary | ICD-10-CM

## 2017-06-11 DIAGNOSIS — D573 Sickle-cell trait: Secondary | ICD-10-CM | POA: Diagnosis present

## 2017-06-11 DIAGNOSIS — O24410 Gestational diabetes mellitus in pregnancy, diet controlled: Secondary | ICD-10-CM

## 2017-06-11 HISTORY — DX: Personal history of other complications of pregnancy, childbirth and the puerperium: Z87.59

## 2017-06-11 HISTORY — DX: Other complications of anesthesia, initial encounter: T88.59XA

## 2017-06-11 HISTORY — DX: Adverse effect of unspecified anesthetic, initial encounter: T41.45XA

## 2017-06-11 LAB — COMPREHENSIVE METABOLIC PANEL
ALBUMIN: 2.3 g/dL — AB (ref 3.5–5.0)
ALK PHOS: 253 U/L — AB (ref 38–126)
ALT: 30 U/L (ref 14–54)
ANION GAP: 12 (ref 5–15)
AST: 40 U/L (ref 15–41)
BILIRUBIN TOTAL: 0.7 mg/dL (ref 0.3–1.2)
BUN: 6 mg/dL (ref 6–20)
CALCIUM: 8.7 mg/dL — AB (ref 8.9–10.3)
CO2: 22 mmol/L (ref 22–32)
CREATININE: 0.73 mg/dL (ref 0.44–1.00)
Chloride: 104 mmol/L (ref 101–111)
GFR calc Af Amer: >60 mL/min (ref >60)
GFR calc non Af Amer: >60 mL/min (ref >60)
GLUCOSE: 77 mg/dL (ref 65–99)
Potassium: 4.3 mmol/L (ref 3.5–5.1)
Sodium: 138 mmol/L (ref 135–145)
TOTAL PROTEIN: 5.7 g/dL — AB (ref 6.5–8.1)

## 2017-06-11 LAB — CBC
HEMATOCRIT: 33 % — AB (ref 36.0–46.0)
HEMOGLOBIN: 11.1 g/dL — AB (ref 12.0–15.0)
MCH: 27.8 pg (ref 26.0–34.0)
MCHC: 33.6 g/dL (ref 30.0–36.0)
MCV: 82.7 fL (ref 78.0–100.0)
Platelets: 194 10*3/uL (ref 150–400)
RBC: 3.99 MIL/uL (ref 3.87–5.11)
RDW: 14 % (ref 11.5–15.5)
WBC: 6.9 10*3/uL (ref 4.0–10.5)

## 2017-06-11 LAB — TYPE AND SCREEN
ABO/RH(D): O POS
ANTIBODY SCREEN: NEGATIVE

## 2017-06-11 LAB — ABO/RH: ABO/RH(D): O POS

## 2017-06-11 LAB — PROTEIN / CREATININE RATIO, URINE
Creatinine, Urine: 45 mg/dL
Protein Creatinine Ratio: 1.96 mg/mg{Cre} — ABNORMAL HIGH (ref 0.00–0.15)
Total Protein, Urine: 88 mg/dL

## 2017-06-11 LAB — GLUCOSE, CAPILLARY: Glucose-Capillary: 103 mg/dL — ABNORMAL HIGH (ref 65–99)

## 2017-06-11 MED ORDER — LABETALOL HCL 5 MG/ML IV SOLN
20.0000 mg | INTRAVENOUS | Status: DC | PRN
  Administered 2017-06-12: 20 mg via INTRAVENOUS
  Filled 2017-06-11: qty 4

## 2017-06-11 MED ORDER — SOD CITRATE-CITRIC ACID 500-334 MG/5ML PO SOLN: 30.0000 mL | ORAL | Status: DC | PRN

## 2017-06-11 MED ORDER — DIPHENHYDRAMINE HCL 50 MG/ML IJ SOLN: 12.5000 mg | INTRAMUSCULAR | Status: DC | PRN

## 2017-06-11 MED ORDER — OXYTOCIN BOLUS FROM INFUSION
500.0000 mL | Freq: Once | INTRAVENOUS | Status: AC
  Administered 2017-06-12: 500 mL via INTRAVENOUS

## 2017-06-11 MED ORDER — OXYTOCIN 10 UNIT/ML IJ SOLN
10.0000 [IU] | Freq: Once | INTRAMUSCULAR | Status: DC | PRN
  Filled 2017-06-11: qty 1

## 2017-06-11 MED ORDER — MAGNESIUM SULFATE 40 G IN LACTATED RINGERS - SIMPLE
2.0000 g/h | INTRAVENOUS | Status: DC
Start: 2017-06-11 — End: 2017-06-12
  Administered 2017-06-12: 2 g/h via INTRAVENOUS
  Filled 2017-06-11 (×3): qty 500

## 2017-06-11 MED ORDER — FENTANYL CITRATE (PF) 100 MCG/2ML IJ SOLN
100.0000 ug | INTRAMUSCULAR | Status: DC | PRN
  Administered 2017-06-11 (×2): 100 ug via INTRAVENOUS
  Filled 2017-06-11 (×2): qty 2

## 2017-06-11 MED ORDER — EPHEDRINE 5 MG/ML INJ
10.0000 mg | INTRAVENOUS | Status: DC | PRN
  Filled 2017-06-11: qty 2

## 2017-06-11 MED ORDER — MISOPROSTOL 50MCG HALF TABLET
50.0000 ug | ORAL_TABLET | ORAL | Status: DC | PRN
Start: 2017-06-11 — End: 2017-06-12
  Administered 2017-06-11: 50 ug via BUCCAL
  Filled 2017-06-11 (×3): qty 1

## 2017-06-11 MED ORDER — FENTANYL 2.5 MCG/ML BUPIVACAINE 1/10 % EPIDURAL INFUSION (WH - ANES)
14.0000 mL/h | INTRAMUSCULAR | Status: DC | PRN
  Administered 2017-06-12: 14 mL/h via EPIDURAL
  Administered 2017-06-12: 12 mL/h via EPIDURAL
  Filled 2017-06-11 (×2): qty 100

## 2017-06-11 MED ORDER — OXYCODONE-ACETAMINOPHEN 5-325 MG PO TABS: 1.0000 | ORAL_TABLET | ORAL | Status: DC | PRN

## 2017-06-11 MED ORDER — TERBUTALINE SULFATE 1 MG/ML IJ SOLN: 0.2500 mg | Freq: Once | INTRAMUSCULAR | Status: DC | PRN

## 2017-06-11 MED ORDER — LACTATED RINGERS IV SOLN: 500.0000 mL | INTRAVENOUS | Status: DC | PRN

## 2017-06-11 MED ORDER — OXYCODONE-ACETAMINOPHEN 5-325 MG PO TABS: 2.0000 | ORAL_TABLET | ORAL | Status: DC | PRN

## 2017-06-11 MED ORDER — MAGNESIUM SULFATE BOLUS VIA INFUSION
4.0000 g | Freq: Once | INTRAVENOUS | Status: AC
  Administered 2017-06-11: 4 g via INTRAVENOUS
  Filled 2017-06-11: qty 500

## 2017-06-11 MED ORDER — PHENYLEPHRINE 40 MCG/ML (10ML) SYRINGE FOR IV PUSH (FOR BLOOD PRESSURE SUPPORT)
80.0000 ug | PREFILLED_SYRINGE | INTRAVENOUS | Status: DC | PRN
  Filled 2017-06-11: qty 10
  Filled 2017-06-11: qty 5

## 2017-06-11 MED ORDER — PHENYLEPHRINE 40 MCG/ML (10ML) SYRINGE FOR IV PUSH (FOR BLOOD PRESSURE SUPPORT)
80.0000 ug | PREFILLED_SYRINGE | INTRAVENOUS | Status: DC | PRN
  Filled 2017-06-11: qty 5

## 2017-06-11 MED ORDER — OXYTOCIN 40 UNITS IN LACTATED RINGERS INFUSION - SIMPLE MED
1.0000 m[IU]/min | INTRAVENOUS | Status: DC
  Administered 2017-06-12: 2 m[IU]/min via INTRAVENOUS
  Filled 2017-06-11: qty 1000

## 2017-06-11 MED ORDER — MISOPROSTOL 25 MCG QUARTER TABLET
25.0000 ug | ORAL_TABLET | ORAL | Status: DC | PRN
  Administered 2017-06-11: 25 ug via VAGINAL
  Filled 2017-06-11: qty 1

## 2017-06-11 MED ORDER — ACETAMINOPHEN 325 MG PO TABS
650.0000 mg | ORAL_TABLET | ORAL | Status: DC | PRN
  Administered 2017-06-11: 650 mg via ORAL
  Filled 2017-06-11: qty 2

## 2017-06-11 MED ORDER — OXYTOCIN 40 UNITS IN LACTATED RINGERS INFUSION - SIMPLE MED: 2.5000 [IU]/h | INTRAVENOUS | Status: DC

## 2017-06-11 MED ORDER — LIDOCAINE HCL (PF) 1 % IJ SOLN
30.0000 mL | INTRAMUSCULAR | Status: AC | PRN
  Administered 2017-06-12: 30 mL via SUBCUTANEOUS
  Filled 2017-06-11: qty 30

## 2017-06-11 MED ORDER — LACTATED RINGERS IV SOLN
500.0000 mL | Freq: Once | INTRAVENOUS | Status: DC
Start: 2017-06-11 — End: 2017-06-12

## 2017-06-11 MED ORDER — LACTATED RINGERS IV SOLN
INTRAVENOUS | Status: DC
Start: 2017-06-11 — End: 2017-06-12
  Administered 2017-06-11 – 2017-06-12 (×2): via INTRAVENOUS

## 2017-06-11 MED ORDER — HYDRALAZINE HCL 20 MG/ML IJ SOLN
10.0000 mg | Freq: Once | INTRAMUSCULAR | Status: DC | PRN
Start: 2017-06-11 — End: 2017-06-12

## 2017-06-11 NOTE — Progress Notes (Signed)
LABOR PROGRESS NOTE  Kim ClimesBianca Brewer is a 31 y.o. G1P0000 at 4442w1d  admitted for IOL for preeclampsia with SF  Subjective: Patient doing well. Just had IV Fentanyl for pain control and working well.  Objective: Vitals:   06/11/17 1851 06/11/17 2044 06/11/17 2101 06/11/17 2121  BP: (!) 143/84 (!) 159/102 (!) 155/100 (!) 132/98  Pulse: 74 92 91 (!) 109  Resp: 20 18 18 18   Temp:  98.2 F (36.8 C)    TempSrc:  Oral    Weight:      Height:        SVE Dilation: 1 Effacement (%): 70 Cervical Position: Posterior Station: -2 Presentation: Vertex Exam by:: Dr. Nira Retortegele FHT: baseline rate 135, moderate varibility, +acel; had one prolonged decel after FB placement and pt throwing up. Recovered with interventions Toco: Ctx q1-5 min, irregular  CBG (last 3)  Recent Labs    06/11/17 1353  GLUCAP 103*     Assessment / Plan: 31 y.o. G1P0 at 5942w1d here for IOL for PreE with SF:  Labor: s/p cytotec x 2 for cervical ripening. FB placed at 21:15. Holding off on cytotec for now to allow FHT to recover Fetal Wellbeing:  Has been Cat I, other than prolonged decel as noted Pain Control:  IV pain meds; may have epidural if she desires Anticipated MOD:  SVD  PreE w/ SF: On IV Mag. No signs/sx of toxicity. Continue to monitor Bps A1GDM: CBG at goal  Frederik PearJulie P Jakyra Kenealy, MD 06/11/2017, 9:46 PM

## 2017-06-11 NOTE — MAU Provider Note (Signed)
History     CSN: [id number]  Arrival date and time: 06/11/17 [id number]   First Provider Initiated Contact with Patient 06/11/17 1015      Chief Complaint  Patient presents with  . Hypertension   HPI  Ms. HPI: Kim Brewer is a 31 y.o. year old G1P0 female at 4641w1d weeks gestation who was sent to MAU from [facility] for elevated BPs. She has not had any problems with elevated BPs until today's OB visit.  She denies any H/A, dizziness or epigastric pain. She reports good (+) FM. The patient is blind.   Past Medical History:  Diagnosis Date  . Blind   . Gestational diabetes   . Sickle cell trait [facility](HCC)     Past Surgical History:  Procedure Laterality Date  . EYE SURGERY  2005    Family History  Problem Relation Age of Onset  . Arthritis Mother   . Hypertension Mother   . Hypertension Father   . Arthritis Father   . Diabetes Father   . Hyperlipidemia Father     Social History   Tobacco Use  . Smoking status: Never Smoker  . Smokeless tobacco: Never Used  Substance Use Topics  . Alcohol use: No  . Drug use: No    Allergies: No Known Allergies  Facility-Administered Medications Prior to Admission  Medication Dose Route Frequency Provider Last Rate Last Dose  . gi cocktail (Maalox,Lidocaine,Donnatal)  30 mL Oral Once Carmelina DaneAnderson, Jeffery S, MD       Medications Prior to Admission  Medication Sig Dispense Refill Last Dose  . calcium carbonate (TUMS - DOSED IN MG ELEMENTAL CALCIUM) 500 MG chewable tablet Chew 1-2 tablets by mouth daily.   06/10/2017 at Unknown time  . Prenatal Vit-Fe Fumarate-FA (MULTIVITAMIN-PRENATAL) 27-0.8 MG TABS tablet Take 1 tablet by mouth daily at 12 noon.   06/10/2017 at Unknown time  . glucose blood test strip Use as instructed. Test Am fasting and 2 hrs. After each meal 124 each 12 Taking    Review of Systems  Constitutional: Negative.   HENT: Negative.   Eyes:       Pt is blind  Respiratory: Negative.   Cardiovascular: Negative.    Gastrointestinal: Negative.   Endocrine: Negative.   Genitourinary: Negative.   Musculoskeletal: Negative.   Skin: Negative.   Allergic/Immunologic: Negative.   Neurological: Negative.   Hematological: Negative.   Psychiatric/Behavioral: Negative.    Physical Exam   Patient Vitals for the past 24 hrs:  BP Temp Temp src Pulse Resp  06/11/17 1131 (!) 158/103 - - 94 -  06/11/17 1116 (!) 158/101 - - 83 -  06/11/17 1101 (!) 167/106 - - 82 -  06/11/17 1046 (!) 154/107 - - 81 -  06/11/17 1032 (!) 152/101 - - 82 -  06/11/17 1016 (!) 147/98 - - 80 -  06/11/17 1002 (!) 157/113 98.8 F (37.1 C) Oral (!) 101 16    Blood pressure (!) 147/98, pulse 80, temperature 98.8 F (37.1 C), temperature source Oral, resp. rate 16, last menstrual period 09/24/2016.  Physical Exam  Constitutional: She is oriented to person, place, and time. She appears well-developed and well-nourished.  HENT:  Head: Normocephalic.  Eyes:  Pt is blind  Neck: Normal range of motion.  Cardiovascular: Normal rate, regular rhythm, normal heart sounds and intact distal pulses.  Respiratory: Effort normal and breath sounds normal.  GI: Soft. Bowel sounds are normal.  Genitourinary:  Genitourinary Comments: Pelvic deferred  Musculoskeletal: She exhibits edema (BLE,  1+ pitting edema).  Neurological: She is alert and oriented to person, place, and time. She has normal reflexes.  Skin: Skin is warm and dry.  Psychiatric: She has a normal mood and affect. Her behavior is normal. Judgment and thought content normal.    MAU Course  Procedures  MDM CCUA CBC CMP P/C Ratio Serial BP's NST - FHR: 140 bpm / moderate variability / accels present / decels absent / TOCO: irregular UC's OB MFM F/U U/S for growth d/t GDM A1  Results for orders placed or performed during the hospital encounter of 06/11/17 (from the past 24 hour(s))  Protein / creatinine ratio, urine     Status: Abnormal   Collection Time: 06/11/17  9:45  AM  Result Value Ref Range   Creatinine, Urine 45.00 mg/dL   Total Protein, Urine 88 mg/dL   Protein Creatinine Ratio 1.96 (H) 0.00 - 0.15 mg/mg[Cre]  CBC     Status: Abnormal   Collection Time: 06/11/17 10:29 AM  Result Value Ref Range   WBC 6.9 4.0 - 10.5 K/uL   RBC 3.99 3.87 - 5.11 MIL/uL   Hemoglobin 11.1 (L) 12.0 - 15.0 g/dL   HCT 40.933.0 (L) 81.136.0 - 91.446.0 %   MCV 82.7 78.0 - 100.0 fL   MCH 27.8 26.0 - 34.0 pg   MCHC 33.6 30.0 - 36.0 g/dL   RDW 78.214.0 95.611.5 - 21.315.5 %   Platelets 194 150 - 400 K/uL    Assessment and Plan  Admit to L&D for IOL d/t PEC Cytotec for cervical ripening MgSO4 4 gm loading dose then 2 gm/hr Report called to Philipp DeputyKim Shaw, CNM; care assumed upon admission  Kim Moraolitta Tiwatope Emmitt, MSN, CNM 06/11/2017, 10:21 AM

## 2017-06-11 NOTE — MAU Note (Signed)
Urine in lab 

## 2017-06-11 NOTE — Progress Notes (Signed)
Patient reports good fetal movement with occasional uterine irritability, denies bleeding. Pt denies headache and dizziness.
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
## 2017-06-11 NOTE — MAU Note (Signed)
Pt was sent from office for high BP. No bleeding or LOF, +FM, Pt is blind, no headaches, abdominal pain or swelling.

## 2017-06-11 NOTE — MAU Note (Signed)
Notified in house provider of second severe range pressure. She advised patient is being admitted and being started on Mag and we will start that medication for the BP.

## 2017-06-12 ENCOUNTER — Inpatient Hospital Stay (HOSPITAL_COMMUNITY): Payer: BLUE CROSS/BLUE SHIELD | Admitting: Anesthesiology

## 2017-06-12 ENCOUNTER — Encounter (HOSPITAL_COMMUNITY): Payer: Self-pay | Admitting: Anesthesiology

## 2017-06-12 DIAGNOSIS — Z3A37 37 weeks gestation of pregnancy: Secondary | ICD-10-CM

## 2017-06-12 DIAGNOSIS — O1414 Severe pre-eclampsia complicating childbirth: Secondary | ICD-10-CM

## 2017-06-12 LAB — CBC
HEMATOCRIT: 31.8 % — AB (ref 36.0–46.0)
HEMATOCRIT: 27.3 % — AB (ref 36.0–46.0)
HEMOGLOBIN: 10.8 g/dL — AB (ref 12.0–15.0)
HEMOGLOBIN: 9.3 g/dL — AB (ref 12.0–15.0)
MCH: 27.9 pg (ref 26.0–34.0)
MCH: 28.3 pg (ref 26.0–34.0)
MCHC: 34 g/dL (ref 30.0–36.0)
MCHC: 34.1 g/dL (ref 30.0–36.0)
MCV: 82.2 fL (ref 78.0–100.0)
MCV: 83 fL (ref 78.0–100.0)
Platelets: 173 10*3/uL (ref 150–400)
Platelets: 173 10*3/uL (ref 150–400)
RBC: 3.87 MIL/uL (ref 3.87–5.11)
RBC: 3.29 MIL/uL — AB (ref 3.87–5.11)
RDW: 13.6 % (ref 11.5–15.5)
RDW: 14.2 % (ref 11.5–15.5)
WBC: 9 10*3/uL (ref 4.0–10.5)
WBC: 16.1 10*3/uL — ABNORMAL HIGH (ref 4.0–10.5)

## 2017-06-12 LAB — RPR: RPR: NONREACTIVE

## 2017-06-12 MED ORDER — BENZOCAINE-MENTHOL 20-0.5 % EX AERO: 1.0000 "application " | INHALATION_SPRAY | CUTANEOUS | Status: DC | PRN

## 2017-06-12 MED ORDER — DIPHENHYDRAMINE HCL 25 MG PO CAPS: 25.0000 mg | ORAL_CAPSULE | Freq: Four times a day (QID) | ORAL | Status: DC | PRN

## 2017-06-12 MED ORDER — LACTATED RINGERS IV SOLN
500.0000 mL | Freq: Once | INTRAVENOUS | Status: AC
  Administered 2017-06-12: 500 mL via INTRAVENOUS

## 2017-06-12 MED ORDER — ONDANSETRON HCL 4 MG PO TABS: 4.0000 mg | ORAL_TABLET | ORAL | Status: DC | PRN

## 2017-06-12 MED ORDER — ONDANSETRON HCL 4 MG/2ML IJ SOLN: 4.0000 mg | INTRAMUSCULAR | Status: DC | PRN

## 2017-06-12 MED ORDER — TETANUS-DIPHTH-ACELL PERTUSSIS 5-2.5-18.5 LF-MCG/0.5 IM SUSP: 0.5000 mL | Freq: Once | INTRAMUSCULAR | Status: DC

## 2017-06-12 MED ORDER — ACETAMINOPHEN 325 MG PO TABS: 650.0000 mg | ORAL_TABLET | ORAL | Status: DC | PRN

## 2017-06-12 MED ORDER — MAGNESIUM SULFATE 40 G IN LACTATED RINGERS - SIMPLE
2.0000 g/h | INTRAVENOUS | Status: AC
  Administered 2017-06-13: 2 g/h via INTRAVENOUS
  Filled 2017-06-12: qty 500

## 2017-06-12 MED ORDER — ONDANSETRON HCL 4 MG/2ML IJ SOLN
4.0000 mg | Freq: Four times a day (QID) | INTRAMUSCULAR | Status: DC | PRN
  Administered 2017-06-12: 4 mg via INTRAVENOUS
  Filled 2017-06-12: qty 2

## 2017-06-12 MED ORDER — PRENATAL MULTIVITAMIN CH
1.0000 | ORAL_TABLET | Freq: Every day | ORAL | Status: DC
  Administered 2017-06-13 – 2017-06-16 (×4): 1 via ORAL
  Filled 2017-06-12 (×4): qty 1

## 2017-06-12 MED ORDER — LACTATED RINGERS IV SOLN
INTRAVENOUS | Status: DC
  Administered 2017-06-13 – 2017-06-16 (×4): via INTRAVENOUS

## 2017-06-12 MED ORDER — IBUPROFEN 600 MG PO TABS
600.0000 mg | ORAL_TABLET | Freq: Four times a day (QID) | ORAL | Status: DC
  Administered 2017-06-12 – 2017-06-16 (×16): 600 mg via ORAL
  Filled 2017-06-12 (×16): qty 1

## 2017-06-12 MED ORDER — DIBUCAINE 1 % RE OINT: 1.0000 "application " | TOPICAL_OINTMENT | RECTAL | Status: DC | PRN

## 2017-06-12 MED ORDER — MISOPROSTOL 200 MCG PO TABS
ORAL_TABLET | ORAL | Status: AC
  Filled 2017-06-12: qty 4

## 2017-06-12 MED ORDER — LIDOCAINE HCL (PF) 1 % IJ SOLN
INTRAMUSCULAR | Status: DC | PRN
  Administered 2017-06-12 (×2): 4 mL via EPIDURAL

## 2017-06-12 MED ORDER — COCONUT OIL OIL
1.0000 | TOPICAL_OIL | Status: DC | PRN
Start: 2017-06-12 — End: 2017-06-16

## 2017-06-12 MED ORDER — SENNOSIDES-DOCUSATE SODIUM 8.6-50 MG PO TABS: 2.0000 | ORAL_TABLET | ORAL | Status: DC

## 2017-06-12 MED ORDER — SENNOSIDES-DOCUSATE SODIUM 8.6-50 MG PO TABS: 2.0000 | ORAL_TABLET | Freq: Every evening | ORAL | Status: DC | PRN

## 2017-06-12 MED ORDER — MISOPROSTOL 200 MCG PO TABS
800.0000 ug | ORAL_TABLET | Freq: Once | ORAL | Status: AC
  Administered 2017-06-12: 800 ug via RECTAL

## 2017-06-12 MED ORDER — SIMETHICONE 80 MG PO CHEW: 80.0000 mg | CHEWABLE_TABLET | ORAL | Status: DC | PRN

## 2017-06-12 MED ORDER — WITCH HAZEL-GLYCERIN EX PADS: 1.0000 "application " | MEDICATED_PAD | CUTANEOUS | Status: DC | PRN

## 2017-06-12 NOTE — Anesthesia Pain Management Evaluation Note (Signed)
  CRNA Pain Management Visit Note  Patient: Watt ClimesBianca Bagg, 31 y.o., female  "Hello I am a member of the anesthesia team at [facility]. We have an anesthesia team available at all times to provide care throughout the hospital, including epidural management and anesthesia for C-section. I don't know your plan for the delivery whether it a natural birth, water birth, IV sedation, nitrous supplementation, doula or epidural, but we want to meet your pain goals."   1.Was your pain managed to your expectations on prior hospitalizations?   Yes   2.What is your expectation for pain management during this hospitalization?     Epidural  3.How can we help you reach that goal? Nursing  Record the patient's initial score and the patient's pain goal.   Pain: 10  Pain Goal: 7 [facility] wants you to be able to say your pain was always managed very well.  Salome ArntSterling, Melitza Metheny Marie 06/12/2017
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
## 2017-06-12 NOTE — Progress Notes (Signed)
LABOR PROGRESS NOTE  Kim ClimesBianca Brewer is a 31 y.o. G1P0000 at 932w1d  admitted for IOL for preeclampsia with SF  Subjective: Patient would like to get epidural  Objective: Vitals:   06/12/17 0142 06/12/17 0144 06/12/17 0146 06/12/17 0151  BP: (!) 143/87 (!) 143/87 (!) 154/96 (!) 148/79  Pulse: 89 89 100 (!) 107  Resp: 18 18 18 18   Temp:      TempSrc:      Weight:      Height:        SVE Dilation: 4 Effacement (%): 70, 80 Cervical Position: Posterior Station: -2 Presentation: Vertex Exam by:: Hermenia BersMichelle Kelly, RN FHT: baseline rate 120, minvaribility, +acel; no decl Toco: Ctx q1-5 min  CBG (last 3)  Recent Labs    06/11/17 1353  GLUCAP 103*     Assessment / Plan: 31 y.o. G1P0 at 7132w1d here for IOL for PreE with SF:  Labor: sFB out at 01:15. Will plan on starting IV Pitocin after patient gets epidural Fetal Wellbeing:  Cat II Pain Control:  Will get epidural Anticipated MOD:  SVD  PreE w/ SF: On IV Mag. No signs/sx of toxicity. Continue to monitor Bps A1GDM: CBG at goal  Kim PearJulie P Raylie Maddison, MD 06/12/2017, 1:56 AM

## 2017-06-12 NOTE — Progress Notes (Signed)
Epidural removed.

## 2017-06-12 NOTE — Anesthesia Procedure Notes (Signed)
Epidural Patient location during procedure: OB Start time: 06/12/2017 1:42 AM  Staffing Anesthesiologist: Mal AmabileFoster, Issaac Shipper, MD Performed: anesthesiologist   Preanesthetic Checklist Completed: patient identified, site marked, surgical consent, pre-op evaluation, timeout performed, IV checked, risks and benefits discussed and monitors and equipment checked  Epidural Patient position: sitting Prep: site prepped and draped and DuraPrep Patient monitoring: continuous pulse ox and blood pressure Approach: midline Location: L3-L4 Injection technique: LOR air  Needle:  Needle type: Tuohy  Needle gauge: 17 G Needle length: 9 cm and 9 Needle insertion depth: 7 cm Catheter type: closed end flexible Catheter size: 19 Gauge Catheter at skin depth: 12 cm Test dose: negative and Other  Assessment Events: blood not aspirated, injection not painful, no injection resistance, negative IV test and no paresthesia  Additional Notes Patient identified. Risks and benefits discussed including failed block, incomplete  Pain control, post dural puncture headache, nerve damage, paralysis, blood pressure Changes, nausea, vomiting, reactions to medications-both toxic and allergic and post Partum back pain. All questions were answered. Patient expressed understanding and wished to proceed. Sterile technique was used throughout procedure. Epidural site was Dressed with sterile barrier dressing. No paresthesias, signs of intravascular injection Or signs of intrathecal spread were encountered.  Patient was more comfortable after the epidural was dosed. Please see RN's note for documentation of vital signs and FHR which are stable.

## 2017-06-12 NOTE — Progress Notes (Signed)
LABOR PROGRESS NOTE  Kim ClimesBianca Brewer is a 31 y.o. G1P0000 at 7766w1d  admitted for IOL for preeclampsia with SF  Subjective: Patient resting comfortably  Objective: Vitals:   06/12/17 0601 06/12/17 0631 06/12/17 0701 06/12/17 0730  BP: (!) 145/92 129/84 133/89 (!) 142/86  Pulse: 81 83 86 89  Resp: 18 18 18 18   Temp:    (!) 97.5 F (36.4 C)  TempSrc:    Oral  Weight:      Height:        SVE Dilation: 8 Effacement (%): 80, 90 Cervical Position: Posterior Station: -2 Presentation: Vertex Exam by:: Hermenia BersMichelle Kelly, RN FHT: baseline rate 115, mod varibility, +acel; no decl Toco: Ctx q2-4 min   Assessment / Plan: 31 y.o. G1P0 at 8666w1d here for IOL for PreE with SF:  Labor: Continue IV Pit, currently at 786mU/min Fetal Wellbeing:  Cat I Pain Control:  epidural Anticipated MOD:  SVD  PreE w/ SF: On IV Mag. No signs/sx of toxicity. Continue to monitor Bps  Frederik PearJulie P Degele, MD 06/12/2017, 7:38 AM

## 2017-06-12 NOTE — Progress Notes (Signed)
Confirmed with Dr. Desmond Lopeurk (anesthesiologist) to remove epidural. Ok to remove.

## 2017-06-12 NOTE — Progress Notes (Signed)
Labor Progress Note Watt ClimesBianca Ayo is a 31 y.o. G1P0000 at 6727w2d presented for IOL for pre-e w/severe features  S:  Mostly comfortable with epidural. Feeling some pain RLQ, using PCEA.  O:  BP (!) 148/100   Pulse 84   Temp 98 F (36.7 C) (Oral)   Resp 18   Ht 5\' 2"  (1.575 m)   Wt 211 lb (95.7 kg)   LMP 09/24/2016   BMI 38.59 kg/m  EFM: baseline 115 bpm/ mod variability/ + accels/ no decels  Toco: 3-5 SVE: Dilation: 10 Dilation Complete Date: 06/12/17 Dilation Complete Time: 1111 Effacement (%): 100 Cervical Position: Posterior Station: 0 Presentation: Vertex Exam by:: Fabian NovemberM. [place], CNM Pitocin: 10 mu/min Hourglassing membranes protruding from vagina, AROM clear fluid  A/P: 31 y.o. G1P0000 4227w2d  1. Labor: active 2. FWB: Cat I 3. Pain: epidural  Will labor down to aide descent then begin pushing. Anticipate SVD.  Donette LarryMelanie Denese Mentink, CNM 11:23 AM

## 2017-06-13 ENCOUNTER — Other Ambulatory Visit: Payer: Self-pay

## 2017-06-13 LAB — CBC
HCT: 23.9 % — ABNORMAL LOW (ref 36.0–46.0)
HEMATOCRIT: 25 % — AB (ref 36.0–46.0)
Hemoglobin: 8.8 g/dL — ABNORMAL LOW (ref 12.0–15.0)
Hemoglobin: 8.3 g/dL — ABNORMAL LOW (ref 12.0–15.0)
MCH: 28.9 pg (ref 26.0–34.0)
MCH: 28.8 pg (ref 26.0–34.0)
MCHC: 35.2 g/dL (ref 30.0–36.0)
MCHC: 34.7 g/dL (ref 30.0–36.0)
MCV: 82.2 fL (ref 78.0–100.0)
MCV: 83 fL (ref 78.0–100.0)
PLATELETS: 161 10*3/uL (ref 150–400)
Platelets: 171 10*3/uL (ref 150–400)
RBC: 3.04 MIL/uL — ABNORMAL LOW (ref 3.87–5.11)
RBC: 2.88 MIL/uL — ABNORMAL LOW (ref 3.87–5.11)
RDW: 14.3 % (ref 11.5–15.5)
RDW: 14.4 % (ref 11.5–15.5)
WBC: 15.5 10*3/uL — AB (ref 4.0–10.5)
WBC: 13.2 10*3/uL — AB (ref 4.0–10.5)

## 2017-06-13 LAB — COMPREHENSIVE METABOLIC PANEL
ALT: 23 U/L (ref 14–54)
ALT: 22 U/L (ref 14–54)
AST: 32 U/L (ref 15–41)
AST: 32 U/L (ref 15–41)
Albumin: 1.8 g/dL — ABNORMAL LOW (ref 3.5–5.0)
Albumin: 1.8 g/dL — ABNORMAL LOW (ref 3.5–5.0)
Alkaline Phosphatase: 191 U/L — ABNORMAL HIGH (ref 38–126)
Alkaline Phosphatase: 163 U/L — ABNORMAL HIGH (ref 38–126)
Anion gap: 7 (ref 5–15)
Anion gap: 9 (ref 5–15)
BILIRUBIN TOTAL: 0.9 mg/dL (ref 0.3–1.2)
BUN: 12 mg/dL (ref 6–20)
BUN: 12 mg/dL (ref 6–20)
CHLORIDE: 102 mmol/L (ref 101–111)
CO2: 24 mmol/L (ref 22–32)
CO2: 21 mmol/L — ABNORMAL LOW (ref 22–32)
CREATININE: 1.15 mg/dL — AB (ref 0.44–1.00)
Calcium: 7.5 mg/dL — ABNORMAL LOW (ref 8.9–10.3)
Calcium: 7.3 mg/dL — ABNORMAL LOW (ref 8.9–10.3)
Chloride: 101 mmol/L (ref 101–111)
Creatinine, Ser: 1.17 mg/dL — ABNORMAL HIGH (ref 0.44–1.00)
GFR calc Af Amer: >60 mL/min (ref >60)
GFR calc Af Amer: >60 mL/min (ref >60)
GLUCOSE: 111 mg/dL — AB (ref 65–99)
Glucose, Bld: 100 mg/dL — ABNORMAL HIGH (ref 65–99)
POTASSIUM: 4.7 mmol/L (ref 3.5–5.1)
Potassium: 5.1 mmol/L (ref 3.5–5.1)
Sodium: 132 mmol/L — ABNORMAL LOW (ref 135–145)
Sodium: 132 mmol/L — ABNORMAL LOW (ref 135–145)
TOTAL PROTEIN: 4.7 g/dL — AB (ref 6.5–8.1)
Total Bilirubin: 0.5 mg/dL (ref 0.3–1.2)
Total Protein: 4.4 g/dL — ABNORMAL LOW (ref 6.5–8.1)

## 2017-06-13 LAB — GLUCOSE, CAPILLARY: GLUCOSE-CAPILLARY: 84 mg/dL (ref 65–99)

## 2017-06-13 MED ORDER — FERROUS GLUCONATE 324 (38 FE) MG PO TABS
324.0000 mg | ORAL_TABLET | Freq: Two times a day (BID) | ORAL | Status: DC
  Administered 2017-06-13 – 2017-06-16 (×7): 324 mg via ORAL
  Filled 2017-06-13 (×10): qty 1

## 2017-06-13 MED ORDER — POLYETHYLENE GLYCOL 3350 17 G PO PACK
17.0000 g | PACK | Freq: Every day | ORAL | Status: DC
  Administered 2017-06-14 – 2017-06-16 (×3): 17 g via ORAL
  Filled 2017-06-13 (×3): qty 1

## 2017-06-13 NOTE — Progress Notes (Addendum)
2044: Baby Arreaga transported to moms room and breast fed for 27 minutes and then returned to central Nursery  0025: Baby Josephs breast fed for 50 minutes . Much assistance was required to achieve same. He was then returned to Central Nursery.  0435: Baby [place] brought to mom's room to be breast fed. Much assistance is required to get baby latch. 
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
## 2017-06-13 NOTE — Anesthesia Postprocedure Evaluation (Signed)
Anesthesia Post Note  Patient: Watt ClimesBianca Difonzo  Procedure(s) Performed: AN AD HOC LABOR EPIDURAL     Patient location during evaluation: [facility] Anesthesia Type: Epidural Level of consciousness: awake and alert, oriented and patient cooperative Pain management: pain level controlled Vital Signs Assessment: post-procedure vital signs reviewed and stable Respiratory status: spontaneous breathing Cardiovascular status: stable Postop Assessment: no headache, epidural receding, patient able to bend at knees and no signs of nausea or vomiting Anesthetic complications: no Comments: Pain score 0.    Last Vitals:  Vitals:   06/13/17 0500 06/13/17 0827  BP:  128/73  Pulse:  83  Resp: 16 18  Temp:  36.8 C  SpO2:  100%    Last Pain:  Vitals:   06/13/17 0827  TempSrc: Oral  PainSc:    Pain Goal:                 [facility],Kamaree Berkel

## 2017-06-13 NOTE — Progress Notes (Signed)
Baby Kim Brewer was brought up to mother's room from central nursery to attempt breastfeeding. Taught mother to hand express. Much assistance was needed to get the baby to latch on properly. Got baby to latch on both breasts. Baby breast fed for 60 minutes on and off. Baby kept falling asleep. Set up breast pump and educated mom on how to use it. Will pump q3h to help milk come in.

## 2017-06-13 NOTE — Lactation Note (Signed)
This note was copied from a baby's chart. Lactation Consultation Note Baby 16 hrs old. 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 Unable to feel with her nipple. Can't find the baby's nose and mouth. Guided mom's nipple to the nose and down to the mouth. Mom stated she didn't have good feeling in her nipple. Mom was taking her fingers feeling. Encouraged mom to cut and file finger nails, a little long and will scratch baby and poke him in the eye.  Mom has large nipples. Baby has a prominent recessed chin. Baby will probably need chin tug after gets latched. Mom states she has support system at home. Staff states most of family appears visibly impaired as well. Encouraged mom to have support person who needs to be trained to assist in baby care and BF, needs to come and be here at hospital for training and hands on before d/c home. Discussed mom pumping, mom wants to use her pump from home, encouraged mom to use hospital grade pump to have more stimulation. Will teach mom how to use home personal pump before d/c home. RN to set up DEBP please. Mom is on mag. LC hopes mom is more able to feel and assess her space, the baby, her breast in order to BF. Also baby is sleepy and not interested that makes it hard as well. LC is concerned about mom BF and support system. LC concerned about baby anatomy as well as mom's.  This will take a lot of training, education, hands on learning.  Mom encouraged to feed baby 8-12 times/24 hours and with feeding cues. Mom is to try to feed baby if not cued in 3 hrs. WH/LC brochure given w/resources, support groups and LC services. Patient Name: Kim [name] ZOXWR'UToday's Date: 06/13/2017 Reason for consult: Initial assessment;Other (Comment);Difficult latch   Maternal Data Has patient been taught Hand Expression?: Yes Does the patient have breastfeeding experience prior to this delivery?: No  Feeding Feeding Type: Breast Fed Length of feed: 0 min  LATCH Score Latch: Too sleepy or  reluctant, no latch achieved, no sucking elicited.  Audible Swallowing: None  Type of Nipple: Everted at rest and after stimulation  Comfort (Breast/Nipple): Soft / non-tender  Hold (Positioning): Full assist, staff holds infant at breast  LATCH Score: 4  Interventions Interventions: Breast feeding basics reviewed;Breast compression;Assisted with latch;Adjust position;Support pillows;Skin to skin;Breast massage;Position options;Hand express;Expressed milk  Lactation Tools Discussed/Used     Consult Status Consult Status: Follow-up Date: 06/13/17 Follow-up type: In-patient    Charyl DancerCARVER, Douglas Rooks G 06/13/2017, 5:44 AM

## 2017-06-13 NOTE — Lactation Note (Signed)
This note was copied from a baby's chart. Lactation Consultation Note  Patient Name: Kim Watt ClimesBianca Roedl WUJWJ'XToday's Date: 06/13/2017 Reason for consult: Follow-up assessment;1st time breastfeeding;Primapara;Early term 37-38.6wks;Difficult latch  RN called for assist sight impaired Mom with latching baby to the breast.  Baby 25 hrs old, 3.3% weight loss, and had initially had 3 breast feedings, but has been sleepy for many hours.  RN instructed to set up DEBP and initiate double pumping every 2-3 hrs after breastfeeding attempts.  Hand expression done for a couple drops of colostrum.  Unable to express any colostrum with DEBP.  Baby alert and in football hold with Mom laid back position.  Placed baby prone on Mom over her breast.  Mom has large, heavy breasts that splay to side.  Rolled up cloth placed under breast for support.  Baby opened widely while LC sandwiched breast and baby able to latch deeply after a couple attempts.    Mom unable to position baby without assistance.  At one time, baby came off the breast and Mom was unaware that baby wasn't latched (Mom states she has little feeling in her nipples).    Tried to latch baby on right breast several times.  Areola more edematous, and baby has a small, narrow mouth with recessed chin.  Tried with a 20 mm nipple shield.    Talked to Mom about the challenge of using a nipple shield without someone to help her.  Baby unable to sustain a latch onto right side.  Switched him to left breast after hand expressing colostrum.  LC sandwiched breast, and positioned baby for her.  Mom stated she felt a pull on nipple.  Recommended pumping both breasts for 15-20 mins after breastfeeding, RN to assist with this each time.  Mom has a Spectra pump for home use.  Due to difficulty latching her newborn independently when unable to see baby's mouth and her nipple, Mom states she was thinking about pumping and bottle feeding her breast milk.  Shared my concern with  managing a newborn without her sight.  Latching a baby can be a challenge with and certainly without ability to visually watch baby's suck pattern, and mouth position on the breast.  Shared concern with SW.  Talked about formula supplementation, which Mom states she doesn't want to offer.  Baby is early term infant, and >6 lbs.  Will continue to watch and assess if he can latch deeply and observe suck/swallow pattern.  Mom aware that baby may need formula until her milk volume comes in, Mom understands that this would be temporary.    Baby has had 5 voids and 4 stools in last 24 hrs.  Mom to call prn for assistance.  LC to see Mom tomorrow prior to possible discharge.   Feeding Feeding Type: Breast Fed Length of feed: 10 min  LATCH Score Latch: Grasps breast easily, tongue down, lips flanged, rhythmical sucking.  Audible Swallowing: A few with stimulation  Type of Nipple: Everted at rest and after stimulation  Comfort (Breast/Nipple): Soft / non-tender  Hold (Positioning): Assistance needed to correctly position infant at breast and maintain latch.  LATCH Score: 8  Interventions Interventions: Breast feeding basics reviewed;Assisted with latch;Skin to skin;Breast massage;Hand express;Position options;DEBP;Adjust position;Support pillows;Breast compression  Lactation Tools Discussed/Used Tools: Pump Breast pump type: Double-Electric Breast Pump   Consult Status Consult Status: Follow-up Date: 06/14/17 Follow-up type: In-patient    Kim Brewer, Kim Brewer E 06/13/2017, 3:15 PM

## 2017-06-13 NOTE — Clinical Social Work Maternal (Signed)
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 and lives alone)   Address:  [place] [address]    Phone number:  [phone] (home)     Additional phone number:   Household Members/Support Persons (HM/SP):       HM/SP Name Relationship DOB or Age  HM/SP -1        HM/SP -2        HM/SP -3        HM/SP -4        HM/SP -5        HM/SP -6        HM/SP -7        HM/SP -8          Natural Supports (not living in the home):  Community, Friends   Professional Supports: None   Employment: Full-time   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  LCSW was able to view MOB attempting to breast feed baby without success.  MOB had difficulty getting baby to latch on without having sight and even with one on one assistance from RN mother could not feed her baby. Lactation notes reflect  that MOB does not know when infant is not latched on and cannot feel her nipples.   MOB reported that she has supportive people in her life that she calls her family stating Godmother, sister and brother and law all live close to her and are available to help out when needed.  MOB reported that FOB does not live with her stating he has "other children and its complicated".  MOB reported that her "godmother Terrence Dupont Games" will come and stay with her when she discharges for a couple of weeks to help with transition.  LCSW asked MOB for godmother's phone number to coordinate discharge planning and MOB declined stating "I don't think that's necessary".  MOB stated that she knows lots of blind people that take care of babies.  LCSW encouraged MOB to provide plans for feeding, bathing etc for baby at home and MOB reported that she will be able to do these things on her own.  MOB reported that she uses her cane and takes transportation when she goes out stating she does  not have any difficulties.  LCSW inquired as to how MOB will take infant out asking if she would need help and MOB replied she had been doing it for awhile although without an infant and was not concerned.  This is MOB's first baby and LCSW asked her and all of her family (sister, brother in law) what they  thought she needed to help care for infant and family said "I can't think of anything".  LCSW has concerns in both witnessing and viewing MOB's attempts to breast feed that suffocation could be a danger as well as many other dangers parenting an infant alone with no sight.  LCSW called and spoke with Wes Earle of [place] CPS to report safety concerns.  CPS will call back with decision about whether MOB can be discharged home without CPS involvement.    CSW Plan/Description:  CSW Awaiting CPS Disposition Plan    Regina G Kujawa, LCSW 06/13/2017, 4:23 PM  

## 2017-06-14 NOTE — Progress Notes (Signed)
Post Partum Day 2 Subjective: no complaints, up ad lib, voiding and tolerating PO  Objective: Blood pressure 134/80, pulse 76, temperature 99.6 F (37.6 C), temperature source Oral, resp. rate 20, height 5\' 2"  (1.575 m), weight 98 kg (216 lb 0.2 oz), last menstrual period 09/24/2016, SpO2 100 %, unknown if currently breastfeeding.  Physical Exam:  General: alert, cooperative and no distress Lochia: appropriate Uterine Fundus: firm Incision: n/a DVT Evaluation: No evidence of DVT seen on physical exam.  Recent Labs    06/13/17 0501 06/13/17 0942  HGB 8.8* 8.3*  HCT 25.0* 23.9*    Assessment/Plan: Contraception IUD  Baby under bililight   LOS: 3 days   Kim Brewer 06/14/2017, 9:51 AM

## 2017-06-14 NOTE — Progress Notes (Signed)
Patient initiating pumping every 3 hours independently.  Patient taught how to use bulb syringe with teach back.   Patient is eager to learn and involved with care.

## 2017-06-14 NOTE — Progress Notes (Signed)
CSW left message for CPS worker[facility] requesting a returned call regarding plans for visit with MOB and disposition plan/safety plan.

## 2017-06-14 NOTE — Progress Notes (Signed)
Rn setup and demonstrated Patient's home DEBP.  Patient demonstrated use of pump with teach back.

## 2017-06-14 NOTE — Lactation Note (Addendum)
This note was copied from a baby's chart. Lactation Consultation Note  Patient Name: Boy Watt ClimesBianca Osso WUJWJ'XToday's Date: 06/14/2017   Visited with Mom, baby 3551 hrs old, 7% weight loss, and under double phototherapy.  Both Mom and baby resting.   Mom began giving baby formula by bottle yesterday evening, but breastfed exclusively all night.  Recommended baby get formula+/EBM supplement after breastfeeding of 20-30 ml.  Mom getting drops of colostrum when she pumps presently.  Mom to pump at least every 3 hrs on initiation setting. Mom has been pumping one breast at a time as she stated she couldn't find some of the pump parts.  It was noted that pump tubing was connected incorrectly.  LC connected the tubing correctly. Talked to Mom about importance of double pumping and increasing her prolactin hormone.  Encouraged STS, breast massage and hand expression along with double pumping.  Reviewed importance of washing pump parts after each use.  Mom to ask for assistance as needed.  Lactation to follow up in am. CPS in to see Mom about plans for assistance on discharge home.     Judee ClaraSmith, Preston Weill E 06/14/2017, 4:20 PM

## 2017-06-14 NOTE — Progress Notes (Signed)
CSW received message from [facility] CPS worker Wes Early stating that the report made yesterday was accepted with a 24 hour response time and that someone will come talk with MOB prior to baby's discharge from the hospital.  CSW informed MOB's bedside RN.  NT states MOB is aware that CPS has been contacted and will be meeting with her.  CSW to follow up with CPS worker.

## 2017-06-14 NOTE — Progress Notes (Addendum)
Attempted supplementation- baby too sleepy to feed following circ.   Mother independent with feed, standby assist by RN.   Mother states her Godmother will stay with her for a few weeks after discharge.

## 2017-06-15 LAB — CBC
HCT: 23.6 % — ABNORMAL LOW (ref 36.0–46.0)
HEMOGLOBIN: 7.9 g/dL — AB (ref 12.0–15.0)
MCH: 28.3 pg (ref 26.0–34.0)
MCHC: 33.5 g/dL (ref 30.0–36.0)
MCV: 84.6 fL (ref 78.0–100.0)
Platelets: 224 10*3/uL (ref 150–400)
RBC: 2.79 MIL/uL — ABNORMAL LOW (ref 3.87–5.11)
RDW: 13.9 % (ref 11.5–15.5)
WBC: 13 10*3/uL — ABNORMAL HIGH (ref 4.0–10.5)

## 2017-06-15 LAB — COMPREHENSIVE METABOLIC PANEL
ALK PHOS: 169 U/L — AB (ref 38–126)
ALT: 55 U/L — ABNORMAL HIGH (ref 14–54)
ANION GAP: 8 (ref 5–15)
AST: 84 U/L — ABNORMAL HIGH (ref 15–41)
Albumin: 2.2 g/dL — ABNORMAL LOW (ref 3.5–5.0)
BILIRUBIN TOTAL: 0.7 mg/dL (ref 0.3–1.2)
BUN: 10 mg/dL (ref 6–20)
CO2: 24 mmol/L (ref 22–32)
Calcium: 8.3 mg/dL — ABNORMAL LOW (ref 8.9–10.3)
Chloride: 106 mmol/L (ref 101–111)
Creatinine, Ser: 0.87 mg/dL (ref 0.44–1.00)
GFR calc non Af Amer: >60 mL/min (ref >60)
Glucose, Bld: 80 mg/dL (ref 65–99)
POTASSIUM: 4.9 mmol/L (ref 3.5–5.1)
SODIUM: 138 mmol/L (ref 135–145)
TOTAL PROTEIN: 5.3 g/dL — AB (ref 6.5–8.1)

## 2017-06-15 MED ORDER — LABETALOL HCL 200 MG PO TABS
200.0000 mg | ORAL_TABLET | Freq: Two times a day (BID) | ORAL | Status: DC
  Administered 2017-06-15 – 2017-06-16 (×3): 200 mg via ORAL
  Filled 2017-06-15 (×2): qty 1
  Filled 2017-06-15: qty 2

## 2017-06-15 NOTE — Discharge Summary (Addendum)
   Physician Discharge Summary  Patient ID: Kim Brewer MRN: [id number] DOB/AGE: [birth year] 32 y.o.  Admit date: 06/11/2017 Discharge date: 06/15/2017   Discharge Diagnoses:  Active Problems:   GDM (gestational diabetes mellitus)   Preeclampsia, third trimester    Hospital Course: Please see HPI dated 06/11/2017 for details. This is a 32 y.o. G1P1001 now PPD#3 from a spontaneous vaginal delivery. She presented for induction of labor for pre-eclampsia with severe range BP. 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 Her post partum course was complicated by elevated BP. She received 24 hrs of MgSO4 post partum. By day 3, she was ambulating, passing flatus and pain was well controlled. She will be discharged home HD#3 in good condition pending improvement in labwork today. Labetalol 200 mg BID started PPD#3 for persistently elevated BP. Instructions for follow up given.    Physical exam  Vitals:   06/14/17 1555 06/14/17 2026 06/15/17 0244 06/15/17 0530  BP: (!) 141/76 140/81 (!) 154/93 (!) 158/90  Pulse: 98 87 85 81  Resp: 18 18 18 18   Temp: 98.5 F (36.9 C) 98.6 F (37 C) 99.1 F (37.3 C) 99.2 F (37.3 C)  TempSrc: Oral Oral  Oral  SpO2: 100% 100% 100% 100%  Weight:      Height:       Physical Exam:  General: alert, oriented, cooperative Chest: CTAB, normal respiratory effort Heart: RRR  Abdomen: +BS, soft, appropriately tender to palpation  Uterine Fundus: firm, 2 fingers below the umbilicus Lochia: moderate, rubra DVT Evaluation: no evidence of DVT Extremities: no edema, no calf tenderness   Postpartum contraception: IUD   Disposition: home  Discharged Condition: fair    Follow-up Information    [facility]. Schedule an appointment as soon as possible for a visit in 4 week(s).   Specialty:  Obstetrics and Gynecology Contact information: [address], [address] [place] [phone]        Has appt 06/18/17 for BP check To f/u 4 weeks for PP check  Signed: Conan BowensKelly M Davis 06/15/2017, 7:58 AM

## 2017-06-15 NOTE — Progress Notes (Signed)
CSW has not heard from CPS worker, despite message left yesterday, and is unable to reach worker today (holiday).  CSW spoke with bedside RN who states MOB and baby are not medically ready for discharge today and will still be here tomorrow.  CSW will follow up with CPS worker tomorrow. 

## 2017-06-15 NOTE — Lactation Note (Signed)
This note was copied from a baby's chart. Lactation Consultation Note  Patient Name: Kim Brewer ZOXWR'UToday's Date: 06/15/2017 Reason for consult: Follow-up assessment Baby at 73 hr of life and mom called for latch help. Upon entry mom was sitting on the edge of the bed holding baby by the head at the breast. Offered to help position baby at the breast to get a deep latch. Mom stated that baby had a dirty diaper and asked for help changing baby. After changing the diaper, lactation had mom move to the chair. Placed the boppy pillow and a hospital pillow on mom's lap then placed baby on the pillow in football position at the L breast. Mom was unable to latch baby independently because she was not able to grasp the nipple and aim the nipple at the baby's mouth. Placed baby in cross cradle on the R breast. Mom was again unable to navigate the nipple into the baby's mouth. Once the baby was latch mom was unable to tell that she was covering the baby's nose with her breast tissue. Mom requires a lot of support to bf baby. Report given to RN.   Maternal Data    Feeding Feeding Type: Breast Fed Length of feed: 30 min(on and off)  LATCH Score                   Interventions Interventions: Assisted with latch;Support pillows;Position options;Adjust position  Lactation Tools Discussed/Used Pump Review: Setup, frequency, and cleaning;Milk Storage Initiated by:: ES Date initiated:: 06/15/17   Consult Status Consult Status: Follow-up Date: 06/16/17 Follow-up type: In-patient    [name] 06/15/2017, 2:16 PM

## 2017-06-15 NOTE — Progress Notes (Signed)
I received a referral word of mouth from pt's nurse due to significant stressors and minimum support.    I spent time with Marena ChancyBianca and her son, [place].  She was in good spirits and at the time of my visit was very appropriate with her baby even as he was fussy.  She is thrilled about being a mom and appears to be bonding well with her baby.  She mentioned that she has good support mostly from her sister and brother-in-law, who live 5 doors down from her,  as well as from her godmother, who plans to stay in her home with her for a few weeks after they are home.  She will also have visits from her brother who lives in [place] but will soon be stationed elsewhere, as well as from her mother, who [place] in [place].  She stated that FOB has chosen not to be involved, but she is not concerning herself with this and stated, "It's not like I'm the first single mother."  Her main concern is breastfeeding.  She would like to give her baby breast milk until he is 2 even if that means pumping and giving it to him.  She has been working with a Advertising copywriterlactation consultant, but is having difficulty latching him.  She is comfortable supplementing with formula when she feels he is hungry after feeding.    She showed strength and courage in how she described aspects of her life journey and utilizes poetry writing as a form of expression and self-care.    She is aware of our ongoing availability, but please also page as needs arise.  Chaplain Dyanne CarrelKaty Quayshaun Hubbert, Bcc Pager, [phone] 3:56 PM    06/15/17 1500  Clinical Encounter Type  Visited With Patient  Visit Type Spiritual support  Referral From Nurse;Social work  Spiritual Encounters  Spiritual Needs Emotional

## 2017-06-15 NOTE — Discharge Instructions (Signed)
Vaginal Delivery, Care After °Refer to this sheet in the next few weeks. These instructions provide you with information about caring for yourself after vaginal delivery. Your health care provider may also give you more specific instructions. Your treatment has been planned according to current medical practices, but problems sometimes occur. Call your health care provider if you have any problems or questions. °What can I expect after the procedure? °After vaginal delivery, it is common to have: °· Some bleeding from your vagina. °· Soreness in your abdomen, your vagina, and the area of skin between your vaginal opening and your anus (perineum). °· Pelvic cramps. °· Fatigue. ° °Follow these instructions at home: °Medicines °· Take over-the-counter and prescription medicines only as told by your health care provider. °· If you were prescribed an antibiotic medicine, take it as told by your health care provider. Do not stop taking the antibiotic until it is finished. °Driving ° °· Do not drive or operate heavy machinery while taking prescription pain medicine. °· Do not drive for 24 hours if you received a sedative. °Lifestyle °· Do not drink alcohol. This is especially important if you are breastfeeding or taking medicine to relieve pain. °· Do not use tobacco products, including cigarettes, chewing tobacco, or e-cigarettes. If you need help quitting, ask your health care provider. °Eating and drinking °· Drink at least 8 eight-ounce glasses of water every day unless you are told not to by your health care provider. If you choose to breastfeed your baby, you may need to drink more water than this. °· Eat high-fiber foods every day. These foods may help prevent or relieve constipation. High-fiber foods include: °? Whole grain cereals and breads. °? Brown rice. °? Beans. °? Fresh fruits and vegetables. °Activity °· Return to your normal activities as told by your health care provider. Ask your health care provider  what activities are safe for you. °· Rest as much as possible. Try to rest or take a nap when your baby is sleeping. °· Do not lift anything that is heavier than your baby or 10 lb (4.5 kg) until your health care provider says that it is safe. °· Talk with your health care provider about when you can engage in sexual activity. This may depend on your: °? Risk of infection. °? Rate of healing. °? Comfort and desire to engage in sexual activity. °Vaginal Care °· If you have an episiotomy or a vaginal tear, check the area every day for signs of infection. Check for: °? More redness, swelling, or pain. °? More fluid or blood. °? Warmth. °? Pus or a bad smell. °· Do not use tampons or douches until your health care provider says this is safe. °· Watch for any blood clots that may pass from your vagina. These may look like clumps of dark red, brown, or black discharge. °General instructions °· Keep your perineum clean and dry as told by your health care provider. °· Wear loose, comfortable clothing. °· Wipe from front to back when you use the toilet. °· Ask your health care provider if you can shower or take a bath. If you had an episiotomy or a perineal tear during labor and delivery, your health care provider may tell you not to take baths for a certain length of time. °· Wear a bra that supports your breasts and fits you well. °· If possible, have someone help you with household activities and help care for your baby for at least a few days after   you leave the hospital. °· Keep all follow-up visits for you and your baby as told by your health care provider. This is important. °Contact a health care provider if: °· You have: °? Vaginal discharge that has a bad smell. °? Difficulty urinating. °? Pain when urinating. °? A sudden increase or decrease in the frequency of your bowel movements. °? More redness, swelling, or pain around your episiotomy or vaginal tear. °? More fluid or blood coming from your episiotomy or  vaginal tear. °? Pus or a bad smell coming from your episiotomy or vaginal tear. °? A fever. °? A rash. °? Little or no interest in activities you used to enjoy. °? Questions about caring for yourself or your baby. °· Your episiotomy or vaginal tear feels warm to the touch. °· Your episiotomy or vaginal tear is separating or does not appear to be healing. °· Your breasts are painful, hard, or turn red. °· You feel unusually sad or worried. °· You feel nauseous or you vomit. °· You pass large blood clots from your vagina. If you pass a blood clot from your vagina, save it to show to your health care provider. Do not flush blood clots down the toilet without having your health care provider look at them. °· You urinate more than usual. °· You are dizzy or light-headed. °· You have not breastfed at all and you have not had a menstrual period for 12 weeks after delivery. °· You have stopped breastfeeding and you have not had a menstrual period for 12 weeks after you stopped breastfeeding. °Get help right away if: °· You have: °? Pain that does not go away or does not get better with medicine. °? Chest pain. °? Difficulty breathing. °? Blurred vision or spots in your vision. °? Thoughts about hurting yourself or your baby. °· You develop pain in your abdomen or in one of your legs. °· You develop a severe headache. °· You faint. °· You bleed from your vagina so much that you fill two sanitary pads in one hour. °This information is not intended to replace advice given to you by your health care provider. Make sure you discuss any questions you have with your health care provider. °Document Released: 05/29/2000 Document Revised: 11/13/2015 Document Reviewed: 06/16/2015 °Elsevier Interactive Patient Education © 2018 [employer] Inc[employer] ° °

## 2017-06-15 NOTE — Lactation Note (Signed)
This note was copied from a baby's chart. Lactation Consultation Note  Patient Name: Kim Brewer UJWJX'BToday's Date: 06/15/2017 Reason for consult: Follow-up assessment Baby at 5969 Hr of life. Baby is currently on dbl photo therapy. Mom reports it is too hard to latch baby while he is on the lights so she has been offering formula bottles and using the DEBP. She has a Spectra DEBP but would prefer the [employer]. Gave DEBP rental information. Discussed baby behavior, feeding frequency, baby belly size, pumping, supplementing volumes, voids, wt loss, breast changes, and nipple care. Mom is aware of lactation services and support group.    Maternal Data Formula Feeding for Exclusion: No  Feeding Feeding Type: Bottle Fed - Formula  LATCH Score                   Interventions Interventions: Breast feeding basics reviewed;Skin to skin;Hand express;Breast massage;Position options;Support pillows;Coconut oil;DEBP;Hand pump  Lactation Tools Discussed/Used Pump Review: Setup, frequency, and cleaning;Milk Storage Initiated by:: ES Date initiated:: 06/15/17   Consult Status Consult Status: Follow-up Date: 06/16/17 Follow-up type: In-patient    [name] 06/15/2017, 10:39 AM

## 2017-06-16 LAB — HEPATIC FUNCTION PANEL
ALBUMIN: 2.3 g/dL — AB (ref 3.5–5.0)
ALT: 51 U/L (ref 14–54)
AST: 62 U/L — AB (ref 15–41)
Alkaline Phosphatase: 145 U/L — ABNORMAL HIGH (ref 38–126)
Bilirubin, Direct: 0.1 mg/dL (ref 0.1–0.5)
Indirect Bilirubin: 0.2 mg/dL — ABNORMAL LOW (ref 0.3–0.9)
TOTAL PROTEIN: 5.7 g/dL — AB (ref 6.5–8.1)
Total Bilirubin: 0.3 mg/dL (ref 0.3–1.2)

## 2017-06-16 MED ORDER — FUROSEMIDE 20 MG PO TABS
20.0000 mg | ORAL_TABLET | Freq: Once | ORAL | Status: AC
  Administered 2017-06-16: 20 mg via ORAL
  Filled 2017-06-16: qty 1

## 2017-06-16 MED ORDER — HYDROCHLOROTHIAZIDE 25 MG PO TABS: 25.0000 mg | ORAL_TABLET | Freq: Every day | ORAL | 3 refills | Status: AC

## 2017-06-16 MED ORDER — LABETALOL HCL 200 MG PO TABS: 200.0000 mg | ORAL_TABLET | Freq: Two times a day (BID) | ORAL | 1 refills | Status: AC

## 2017-06-16 MED ORDER — IBUPROFEN 600 MG PO TABS: 600.0000 mg | ORAL_TABLET | Freq: Four times a day (QID) | ORAL | 0 refills | Status: AC

## 2017-06-16 NOTE — Progress Notes (Signed)
CSW spoke with CPS worker/S. Brooks who states he has spoken with MOB and developed a Safety Plan to comply with all medical recommendations.  CSW asked if there is someone who can be in the home with MOB to assist her at least until baby is bigger and more established with breast feeding.  He states there is no one in the home with her.  CSW asked if he would speak with pediatrician and lactation consultant regarding concerns.  He agreed and CSW provided contact information and requested a return call once he has spoken with staff caring for mother and baby.

## 2017-06-16 NOTE — Progress Notes (Signed)
CSW has not received a return call from CPS worker.  CSW called CPS worker/S. Brooks to follow up regarding Safety Plan.  He states he has spoken with the pediatrician, but has not had a chance to speak with lactation consultant.  CSW offered to fax lactation documentation to his office.  He reports no barriers to discharge of baby to MOB's care today.  CSW informed bedside RN. 

## 2017-06-16 NOTE — Lactation Note (Addendum)
This note was copied from a baby's chart. Lactation Consultation Note  Patient Name: Kim Brewer ClimesBianca Bobeck WUJWJ'XToday's Date: 06/16/2017   P1, Early term infant.  Baby 92 hours old.  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  Baby on phototherapy. Mother states baby had a 4 hr fussy period this morning where he would not latch. Discussed cluster feeding. Mother would like to exclusively breastfeed.  She pumped approx 15 ml this morning.  She is also supplementing w/ formula.  Mother states she will be pumping and going back to work. She is planning on inquring about a rental pump from [employer]. She likes the hospital grade DEBP more than her personal Spectra pump. LC will return to evaluate latch.  Baby taken off phototherapy and cueing. Mother recognized feeding cues and asked for assistance w/ pillows. Mother latched baby on L breast with depth assistance.  It seems she has improved w/ latching infant.  Latch still shallow and needed head guidance to achieve more depth. Encouraged breast compression to keep baby active. Praised mother for her efforts but still feel how crucial it would be to have someone with mother 24/7 to help care for baby as [facility] provided much needed assistance during feeding.  Noted during breastfeeding session that baby became sleepy possibly due to early term or jaundice.  Explained this to mother and the importance of supplementing after breastfeeding w/ breastmilk and/or formula. At one point, mother thought baby was still on breast but baby was unlatched. Taught mother to feel the tip of her nipple and the infant's mouth. Also suggested mother feel under infant's chin for swallows with breastfeeding and bottle feeding. Mother had pumped 15 ml this morning.  Assisted her with placing nipple on bottle and getting in position for bottle feeding.  Noted mother holding bottle in infant's mouth while it was empty.  Prepared additional bottle with formula for additional supplementation.  Mother  verbalized she is unable to determine how much breastmilk or formula is in bottle and unsure when he was done. Shared my concern with managing a newborn without her sight and the importance of having someone with her during the newborn period 24/7.  She states she will not have someone with her all the time because of work schedules.  Shared concern with RN and SW.   Discussed engorgement care.  Mother has pumped apprx 2 times per day.  To help her establish her milk supply suggest pupnig q 3h hours.       Maternal Data    Feeding Feeding Type: Bottle Fed - Formula Nipple Type: Regular Length of feed: 30 min  LATCH Score                   Interventions    Lactation Tools Discussed/Used     Consult Status      Hardie PulleyBerkelhammer, Ruth Boschen 06/16/2017, 9:27 AM

## 2017-06-16 NOTE — Discharge Summary (Signed)
OB Discharge Summary     Patient Name: Kim Brewer DOB: November 15, 1985 MRN: [id number]  Date of admission: 06/11/2017 Delivering MD: Donette LarryBHAMBRI, MELANIE   Date of discharge: 06/16/2017  Admitting diagnosis: 37.1wks, increased bp Intrauterine pregnancy: 7961w2d     Secondary diagnosis:  Active Problems:   GDM (gestational diabetes mellitus)   Preeclampsia, third trimester  Additional problems: mild LFT elevation     Discharge diagnosis: GDM A2 , preeclampsia                                                                                               Post partum procedures:none  Augmentation: Pitocin and Cytotec  Complications: None  Hospital course:  Induction of Labor With Vaginal Delivery   32 y.o. yo G1P1001 at 9261w2d was admitted to the hospital 06/11/2017 for induction of labor.  Indication for induction: Preeclampsia.  Patient had an uncomplicated labor course as follows: Membrane Rupture Time/Date: 11:11 AM ,06/12/2017   Intrapartum Procedures: Episiotomy: None [1]                                         Lacerations:  2nd degree [3]  Patient had delivery of a Viable infant.  Information for the patient's newborn:  Burt KnackJohnson, Boy [place] [[id number]][[id number]]  Delivery Method: Vag-Spont   06/12/2017  Details of delivery can be found in separate delivery note.  Patient had a routine postpartum course. Patient is discharged home 06/16/17.  Physical exam  Vitals:   06/15/17 1948 06/15/17 2300 06/16/17 0358 06/16/17 0732  BP: 138/89 118/79 (!) 145/79 (!) 150/100  Pulse: 85 94 79 75  Resp: 18 18 18 19   Temp: 98.2 F (36.8 C) 98.5 F (36.9 C) 98.3 F (36.8 C) 98.4 F (36.9 C)  TempSrc: Oral Oral Oral Oral  SpO2: 100% 100% 100% 100%  Weight:   96.6 kg (213 lb 0.3 oz)   Height:       General: alert, cooperative and no distress Lochia: appropriate Uterine Fundus: firm Incision: N/A DVT Evaluation: No evidence of DVT seen on physical exam. LE edema noted Labs: Lab Results   Component Value Date   WBC 13.0 (H) 06/15/2017   HGB 7.9 (L) 06/15/2017   HCT 23.6 (L) 06/15/2017   MCV 84.6 06/15/2017   PLT 224 06/15/2017   CMP Latest Ref Rng & Units 06/16/2017  Glucose 65 - 99 mg/dL -  BUN 6 - 20 mg/dL -  Creatinine 7.250.44 - 3.661.00 mg/dL -  Sodium 440135 - 347145 mmol/L -  Potassium 3.5 - 5.1 mmol/L -  Chloride 101 - 111 mmol/L -  CO2 22 - 32 mmol/L -  Calcium 8.9 - 10.3 mg/dL -  Total Protein 6.5 - 8.1 g/dL 4.2(V5.7(L)  Total Bilirubin 0.3 - 1.2 mg/dL 0.3  Alkaline Phos 38 - 126 U/L 145(H)  AST 15 - 41 U/L 62(H)  ALT 14 - 54 U/L 51    Discharge instruction: per After Visit Summary and "Baby and Me Booklet".  After visit meds:  Allergies  as of 06/16/2017   No Known Allergies     Medication List    STOP taking these medications   calcium carbonate 500 MG chewable tablet Commonly known as:  TUMS - dosed in mg elemental calcium   glucose blood test strip     TAKE these medications   hydrochlorothiazide 25 MG tablet Commonly known as:  HYDRODIURIL Take 1 tablet (25 mg total) by mouth daily.   ibuprofen 600 MG tablet Commonly known as:  ADVIL,MOTRIN Take 1 tablet (600 mg total) by mouth every 6 (six) hours.   labetalol 200 MG tablet Commonly known as:  NORMODYNE Take 1 tablet (200 mg total) by mouth 2 (two) times daily.   multivitamin-prenatal 27-0.8 MG Tabs tablet Take 1 tablet by mouth daily at 12 noon.       Diet: carb modified diet  Activity: Advance as tolerated. Pelvic rest for 6 weeks.   Outpatient follow up:BP check in 2 days Follow up Appt: Future Appointments  Date Time Provider Department Center  06/18/2017  8:45 AM [facility] NURSE [facility] None  07/26/2017  8:30 AM [facility] LAB [facility] None  07/26/2017  8:45 AM Anyanwu, Jethro Bastos, MD [facility] None   Follow up Visit:No Follow-up on file.  Postpartum contraception: IUD Mirena  Newborn Data: Live born female  Birth Weight: 6 lb 4.5 oz (2849 g) APGAR: 8, 9  Newborn Delivery   Birth  date/time:  06/12/2017 12:54:00 Delivery type:  Vaginal, Spontaneous     Baby Feeding: Breast Disposition:rooming in   06/16/2017 Scheryl Darter, MD

## 2017-06-16 NOTE — Progress Notes (Signed)
Patient given discharge instructions, Baby and Me book reviewed with patient and pt alerted to the app at the front of the book, pt stating that she can download that when she gets home and have the information read aloud. Patient was able to demonstrate independent diaper change, cleaning of breast pump parts, RN assisting with breast latch one last time with little success, infant only latching briefly with few sucks. Pt demonstrated Breast massage. Patient then supplementing infant with breast milk and then formula. Patient has ride for infant dr appointment in the morning. Feeding schedule reviewed, (8x/24hr) and reviewed with patient temps on baby abd keeping baby wrapped and covered unless skin to skin. Pt verbalizes understanding and has no additional questions at this time.  

## 2017-06-17 LAB — HEPATITIS PANEL, ACUTE
HEP A IGM: NEGATIVE
HEP B S AG: NEGATIVE
Hep B C IgM: NEGATIVE

## 2017-06-18 ENCOUNTER — Ambulatory Visit (INDEPENDENT_AMBULATORY_CARE_PROVIDER_SITE_OTHER): Payer: BLUE CROSS/BLUE SHIELD

## 2017-06-18 VITALS — BP 105/68 | HR 89

## 2017-06-18 DIAGNOSIS — O10919 Unspecified pre-existing hypertension complicating pregnancy, unspecified trimester: Secondary | ICD-10-CM

## 2017-06-18 DIAGNOSIS — Z3483 Encounter for supervision of other normal pregnancy, third trimester: Secondary | ICD-10-CM

## 2017-06-18 NOTE — Progress Notes (Addendum)
Patient presents for Nurse visit Only today for Blood Pressure Check.  Pt states she has no concerns today.   Pt had induction w/vaginal delivery @[redacted]w[redacted]d  06/11/17 delivered 06/12/17 Discharged 06/16/17  Pt currently on Hydrochlorothinzide 25mg  1 PO everyday  Labetalol HCL 200mg  1 PO BID   Per  Dorathy KinsmanVirginia Smith CNM pt is to return in 1 wk for repeat Blood pressure. And contact the office should she have any headaches , dizziness etc.

## 2017-06-25 ENCOUNTER — Ambulatory Visit: Payer: BLUE CROSS/BLUE SHIELD

## 2017-06-25 VITALS — BP 99/68 | HR 82

## 2017-06-25 DIAGNOSIS — Z0130 Encounter for examination of blood pressure without abnormal findings: Secondary | ICD-10-CM

## 2017-06-25 NOTE — Progress Notes (Signed)
Subjective:  Kim Brewer is a 32 y.o. female with hypertension. Current Outpatient Medications  Medication Sig Dispense Refill  . hydrochlorothiazide (HYDRODIURIL) 25 MG tablet Take 1 tablet (25 mg total) by mouth daily. 30 tablet 3  . labetalol (NORMODYNE) 200 MG tablet Take 1 tablet (200 mg total) by mouth 2 (two) times daily. 40 tablet 1  . Prenatal Vit-Fe Fumarate-FA (MULTIVITAMIN-PRENATAL) 27-0.8 MG TABS tablet Take 1 tablet by mouth daily at 12 noon.    Marland Kitchen. ibuprofen (ADVIL,MOTRIN) 600 MG tablet Take 1 tablet (600 mg total) by mouth every 6 (six) hours. (Patient not taking: Reported on 06/25/2017) 30 tablet 0   No current facility-administered medications for this visit.     Hypertension ROS: taking medications as instructed, no medication side effects noted, no TIA's, no chest pain on exertion, no dyspnea on exertion and no swelling of ankles.  New concerns: None .   Objective:  LMP 09/24/2016   Appearance alert, well appearing, and in no distress. General exam BP noted to be well controlled today in office.    Assessment:   Hypertension well controlled.   Plan:  Pt to discontinue Labetalol and HCTZ per [place], [place] .

## 2017-06-25 NOTE — Progress Notes (Signed)
Here for RN BP check. On Labetalol 200 BID and HCTZ 25 QD. Is Breastfeeding. BP 99/68. May D/C both meds and F/U in 4 weeks for PP visit and GTT.  Katrinka BlazingSmith, [place], CNM 06/25/2017 11:23 AM

## 2017-07-09 NOTE — Progress Notes (Signed)
Pt here for BP check. No dizziness or Pre-E Sx. Continue meds. BP check in 1 week.

## 2017-07-12 ENCOUNTER — Ambulatory Visit: Payer: Self-pay | Admitting: Obstetrics and Gynecology

## 2017-07-26 ENCOUNTER — Other Ambulatory Visit: Payer: Medicare Other

## 2017-07-26 ENCOUNTER — Encounter: Payer: Self-pay | Admitting: Obstetrics & Gynecology

## 2017-07-26 ENCOUNTER — Ambulatory Visit (INDEPENDENT_AMBULATORY_CARE_PROVIDER_SITE_OTHER): Payer: BLUE CROSS/BLUE SHIELD | Admitting: Obstetrics & Gynecology

## 2017-07-26 DIAGNOSIS — Z30430 Encounter for insertion of intrauterine contraceptive device: Secondary | ICD-10-CM | POA: Diagnosis not present

## 2017-07-26 DIAGNOSIS — Z8759 Personal history of other complications of pregnancy, childbirth and the puerperium: Secondary | ICD-10-CM

## 2017-07-26 DIAGNOSIS — Z8632 Personal history of gestational diabetes: Secondary | ICD-10-CM

## 2017-07-26 DIAGNOSIS — Z1389 Encounter for screening for other disorder: Secondary | ICD-10-CM

## 2017-07-26 DIAGNOSIS — Z3202 Encounter for pregnancy test, result negative: Secondary | ICD-10-CM | POA: Diagnosis not present

## 2017-07-26 LAB — POCT URINE PREGNANCY: Preg Test, Ur: NEGATIVE

## 2017-07-26 MED ORDER — PARAGARD INTRAUTERINE COPPER IU IUD
1.0000 [IU] | INTRAUTERINE_SYSTEM | Freq: Once | INTRAUTERINE | Status: AC
  Administered 2017-07-26: 1 [IU] via INTRAUTERINE

## 2017-07-26 NOTE — Patient Instructions (Signed)

## 2017-07-26 NOTE — Progress Notes (Signed)
Post Partum Exam  Kim Brewer is a 32 y.o. 421P1001 female who presents for a postpartum visit. She is 6 weeks postpartum following a spontaneous vaginal delivery. The delivery was at 37 gestational weeks after IOL due to preeclampsia, patient also had A1GDM.   I have fully reviewed the prenatal and intrapartum course. Anesthesia: epidural. Postpartum course has been good. Baby's course has been good. Baby is feeding by breast. Bleeding staining only. Bowel function is normal. Bladder function is normal. Patient is not sexually active. Contraception method is none; desires Paragard IUD. Postpartum depression screening:neg  The following portions of the patient's history were reviewed and updated as appropriate: allergies, current medications, past family history, past medical history, past social history, past surgical history and problem list. Last pap smear done 01/06/2017 and was Normal  Review of Systems Pertinent items noted in HPI and remainder of comprehensive ROS otherwise negative.    Objective:  Blood pressure 121/79, pulse 88, height 5\' 2"  (1.575 m), weight 186 lb 14.4 oz (84.8 kg), last menstrual period 09/24/2016, currently breastfeeding.  General:  alert and no distress   Breasts:  inspection negative, no nipple discharge or bleeding, no masses or nodularity palpable  Lungs: clear to auscultation bilaterally  Heart:  regular rate and rhythm  Abdomen: soft, non-tender; bowel sounds normal; no masses,  no organomegaly   Vulva:  normal  Vagina: normal vagina, no discharge, exudate, lesion, or erythema  Cervix:  multiparous appearance, no cervical motion tenderness and no lesions  Corpus: normal size, contour, position, consistency, mobility, non-tender  Adnexa:  no mass, fullness, tenderness  Rectal Exam: Not performed.      IUD Insertion Procedure Note Patient identified, informed consent performed, consent signed.   Discussed risks of irregular bleeding, cramping, infection,  malpositioning or misplacement of the IUD outside the uterus which may require further procedure such as laparoscopy. Time out was performed.  Urine pregnancy test negative.  Speculum placed in the vagina.  Cervix visualized.  Cleaned with Betadine x 3.  Grasped anteriorly with a single tooth tenaculum.  Paragard IUD placed per manufacturer's recommendations.  Strings trimmed to 2 cm. Tenaculum was removed, good hemostasis noted.  Patient tolerated procedure well. Ibuprofen given for analgesia at end of procedure.   Assessment:   Normal postpartum exam. Paragard IUD placed today.  Plan:   1. Contraception: Paragard IUD placed today.  Patient was given post-procedure instructions.  She was advised to have backup contraception for one week.  Patient was also asked to check IUD strings periodically and follow up in 4 weeks for IUD check. 2. H/O Preeclampsia: Stable BP today, off meds.  Will follow up with PCP 3. H/O GDM: Was not fasting today, GTT postponed.   Return in about 4 weeks (around 08/23/2017) for IUD check and 2 hr GTT (postpartum).   Kim Brewer  Kim Yore, MD, FACOG Obstetrician & Gynecologist, [facility] for [facility], [facility]

## 2017-07-26 NOTE — Addendum Note (Signed)
Addended by: Jaynie CollinsANYANWU, Jakylah Bassinger A on: 07/26/2017 09:47 AM   Modules accepted: Orders

## 2017-08-23 ENCOUNTER — Ambulatory Visit: Payer: BLUE CROSS/BLUE SHIELD | Admitting: Obstetrics and Gynecology

## 2017-08-23 ENCOUNTER — Other Ambulatory Visit: Payer: BLUE CROSS/BLUE SHIELD

## 2017-08-24 ENCOUNTER — Encounter: Payer: Self-pay | Admitting: *Deleted

## 2017-08-31 ENCOUNTER — Encounter: Payer: Self-pay | Admitting: Obstetrics and Gynecology

## 2017-08-31 ENCOUNTER — Ambulatory Visit (INDEPENDENT_AMBULATORY_CARE_PROVIDER_SITE_OTHER): Payer: Medicare Other | Admitting: Obstetrics and Gynecology

## 2017-08-31 ENCOUNTER — Other Ambulatory Visit: Payer: BLUE CROSS/BLUE SHIELD

## 2017-08-31 VITALS — BP 121/85 | HR 76 | Ht 62.0 in | Wt 206.0 lb

## 2017-08-31 DIAGNOSIS — O24410 Gestational diabetes mellitus in pregnancy, diet controlled: Secondary | ICD-10-CM

## 2017-08-31 DIAGNOSIS — Z30431 Encounter for routine checking of intrauterine contraceptive device: Secondary | ICD-10-CM

## 2017-08-31 NOTE — Progress Notes (Signed)
   IUD Insertion Follow Up   Patient is 32 y.o. G1P1001 who is here for string check/followup after insertion of Paraguard IUD 5 weeks ago. She is doing well, had minor pain after procedure that is now resolved. She had minimal spotting after procedure. Has not had a period yet. Denies pain or other issues.  LMP 09/24/2016   Gen: alert, oriented Abd: soft, non-tender GU: normal appearing vaginal mucosa, normal appearing cervix with white strings protruding 3 cm from cervix. Strings not trimmed.    Normal post IUD insertion check. Patient also doing 2 hr GTT today. Return 1 year for annual or prn    K. Therese SarahMeryl Taiya Nutting, M.D. Attending Obstetrician & Gynecologist, [facility], [facility]

## 2017-09-01 LAB — GLUCOSE TOLERANCE, 2 HOURS
Glucose, 2 hour: 91 mg/dL (ref 65–139)
Glucose, GTT - Fasting: 81 mg/dL (ref 65–99)

## 2019-06-13 IMAGING — US US MFM OB DETAIL+14 WK
1 series · 14 of 28 positions shown · non-contrast
Comparison: none

[Series 1: us mfm ob detail+14 wk · 14 of 84 slices shown]
[im 4/84]
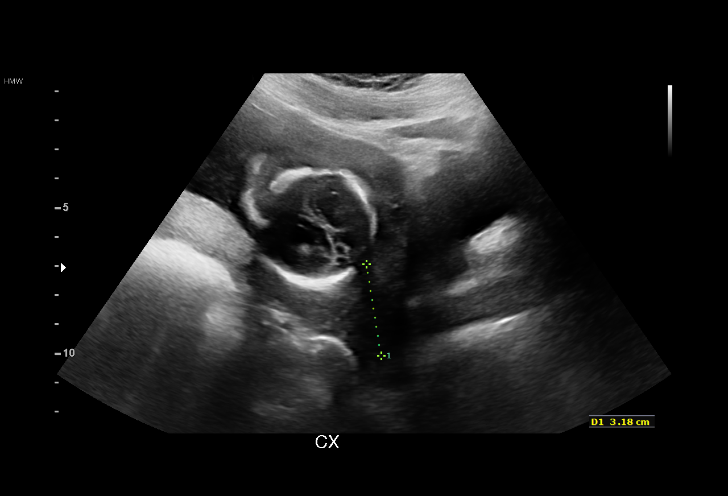
[im 10/84]
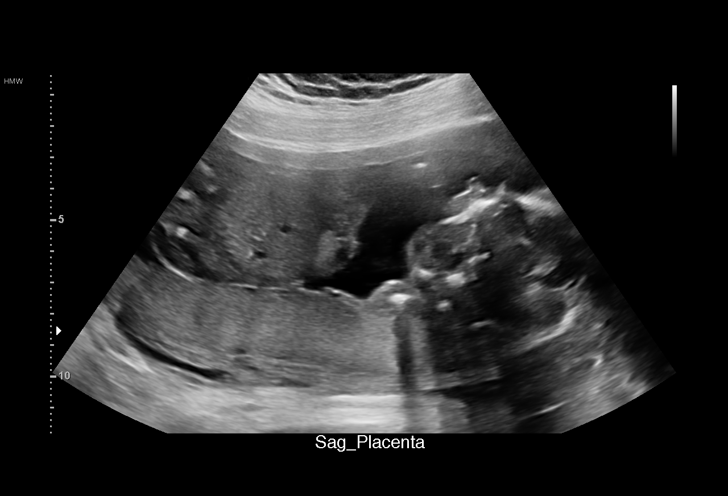
[im 16/84]
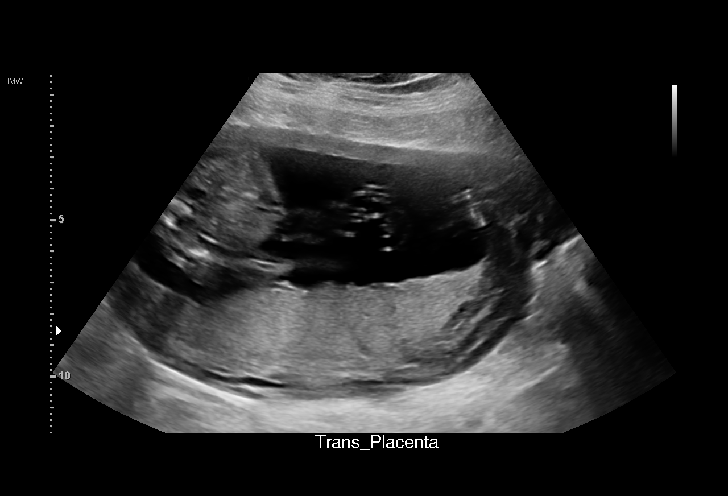
[im 22/84]
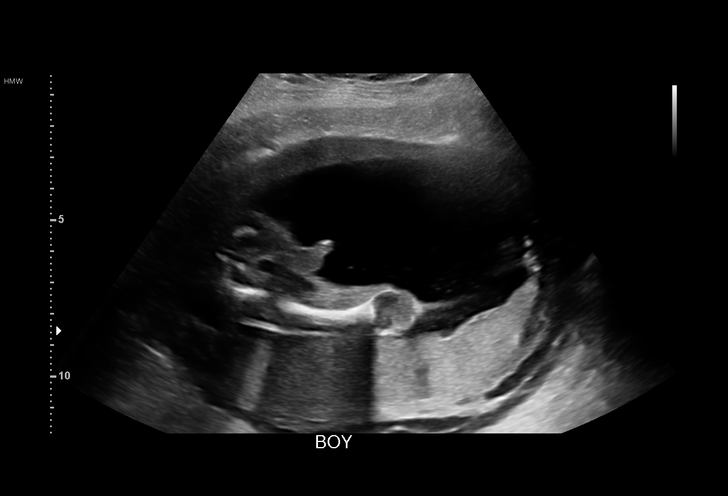
[im 28/84]
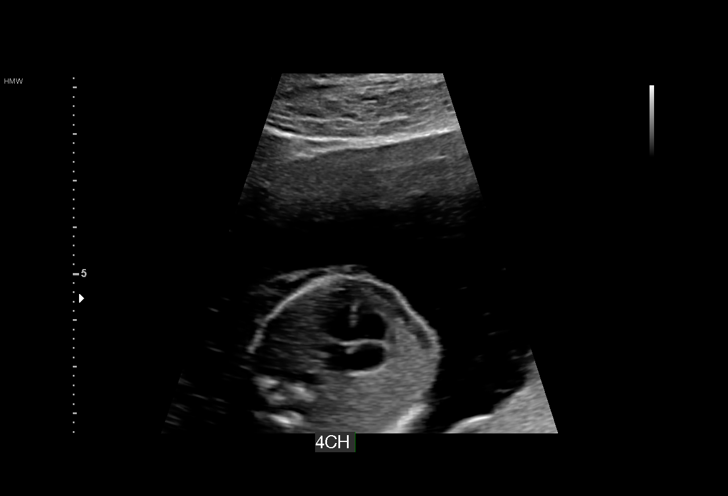
[im 34/84]
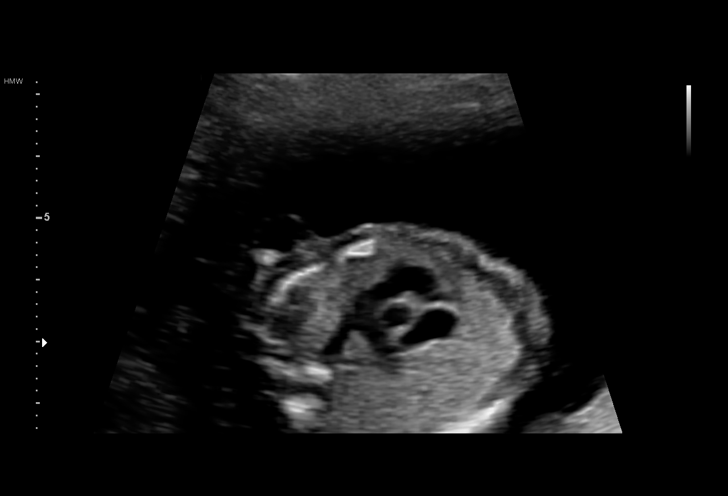
[im 40/84]
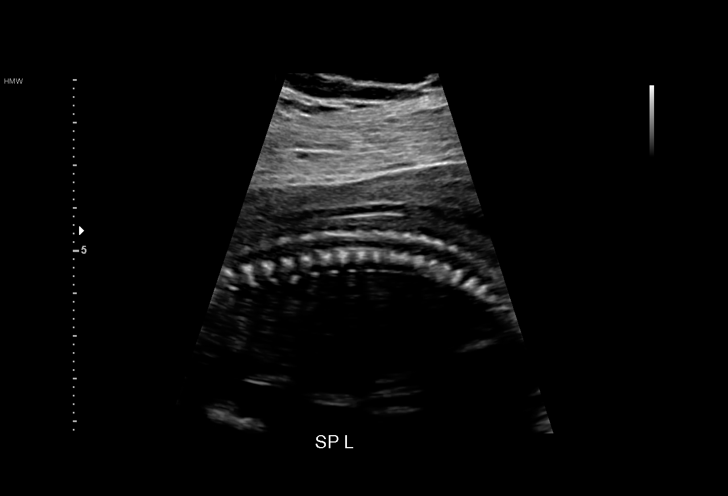
[im 47/84]
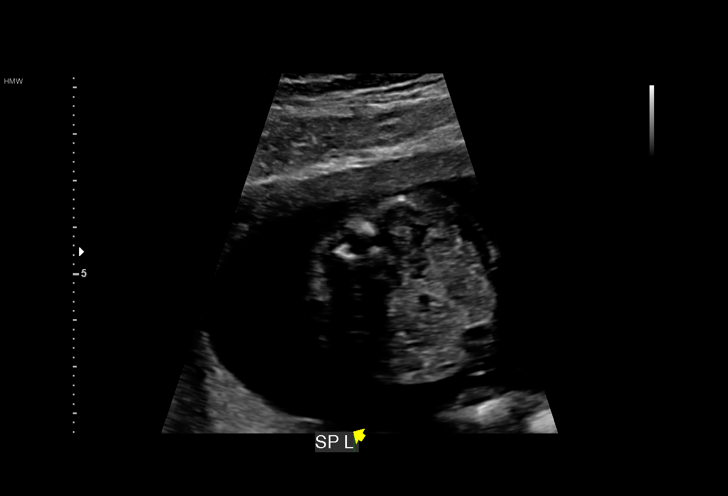
[im 53/84]
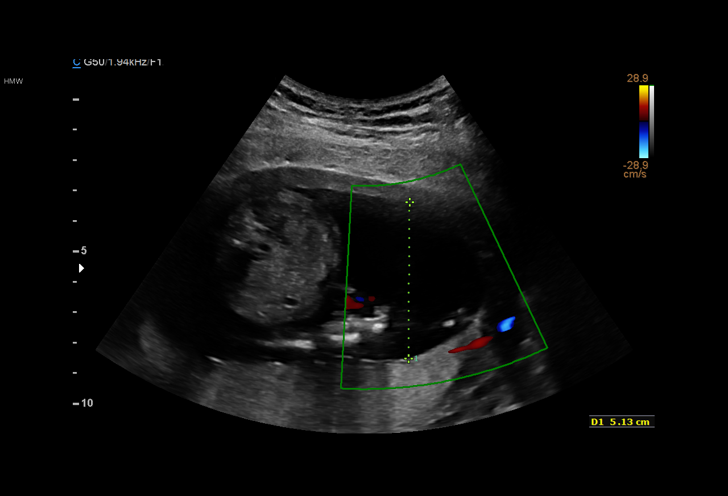
[im 59/84]
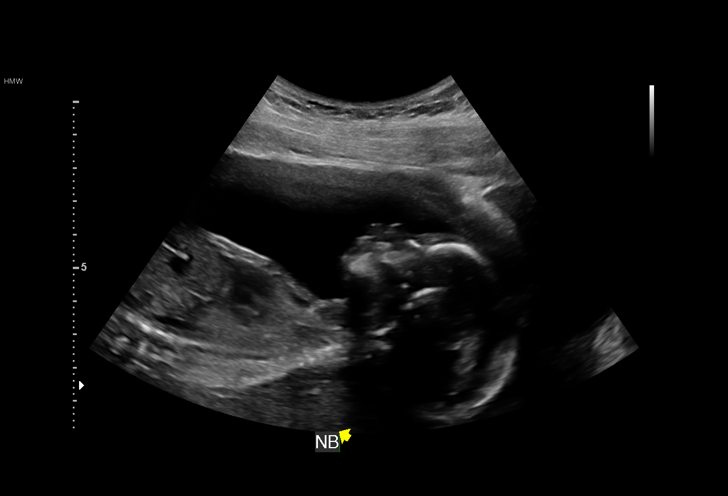
[im 65/84]
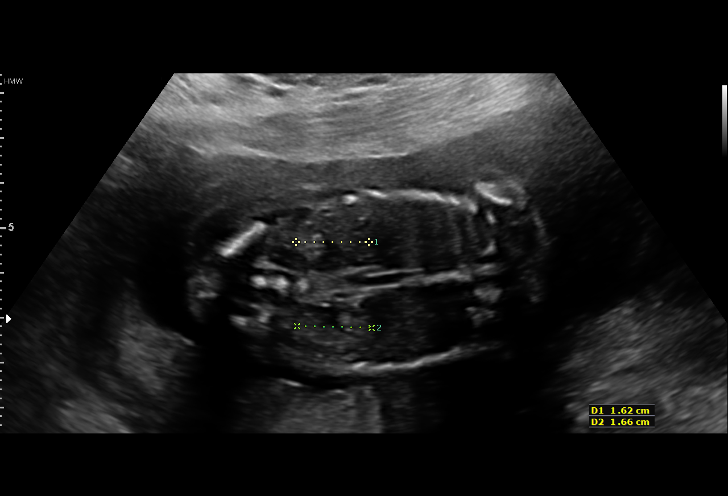
[im 71/84]
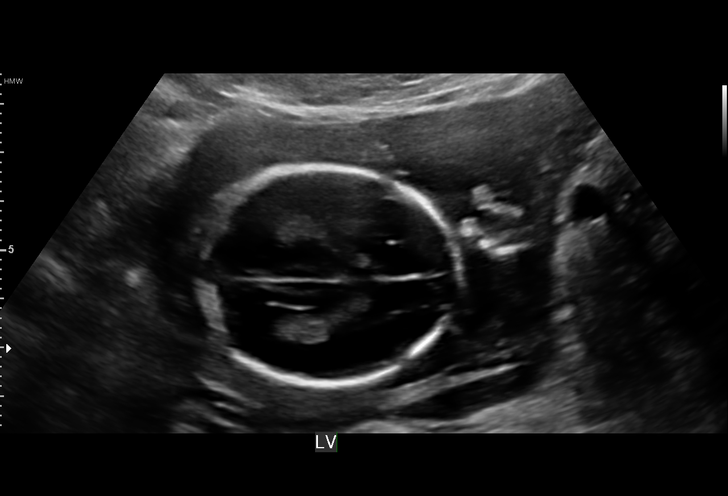
[im 77/84]
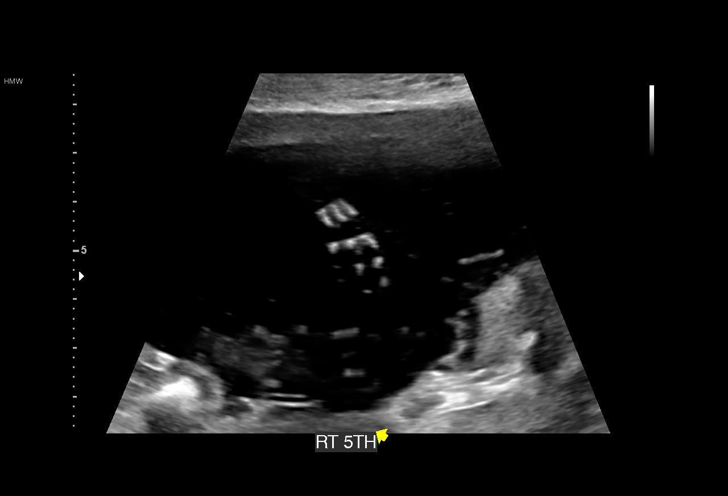
[im 84/84]
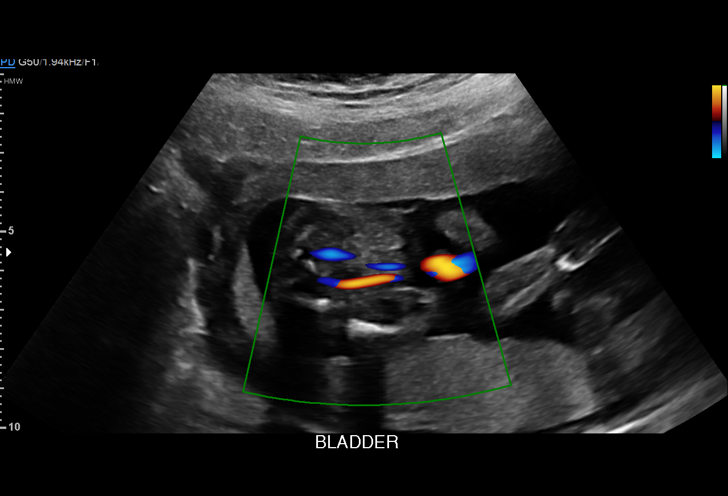

[14 of 28 positions shown; findings below may reference images not displayed]

Road [HOSPITAL]

Indications

19 weeks gestation of pregnancy
Encounter for fetal anatomic survey
Obesity complicating pregnancy, second
trimester

OB History

Blood Type:            Height:  5'3"   Weight (lb):  188       BMI:
Gravidity:    1
Fetal Evaluation

Num Of Fetuses:     1
Fetal Heart         137
Rate(bpm):
Cardiac Activity:   Observed
Presentation:       Cephalic
Placenta:           Posterior, above cervical os
P. Cord Insertion:  Visualized

Amniotic Fluid
AFI FV:      Subjectively within normal limits

Largest Pocket(cm)
5.1
Biometry

BPD:      45.3  mm     G. Age:  19w 5d         79  %    CI:        76.99   %    70 - 86
FL/HC:      17.3   %    16.1 -
HC:      163.5  mm     G. Age:  19w 1d         48  %    HC/AC:      1.15        1.09 -
AC:       142   mm     G. Age:  19w 4d         64  %    FL/BPD:     62.5   %
FL:       28.3  mm     G. Age:  18w 5d         32  %    FL/AC:      19.9   %    20 - 24
HUM:      25.3  mm     G. Age:  18w 0d         20  %

Est. FW:     277  gm    0 lb 10 oz      48  %
Gestational Age

LMP:           19w 0d        Date:  09/24/16                 EDD:   07/01/17
U/S Today:     19w 2d                                        EDD:   06/29/17
Best:          19w 0d     Det. By:  LMP  (09/24/16)          EDD:   07/01/17
Anatomy

Cranium:               Appears normal         Aortic Arch:            Appears normal
Cavum:                 Appears normal         Ductal Arch:            Not well visualized
Ventricles:            Appears normal         Diaphragm:              Appears normal
Choroid Plexus:        Appears normal         Stomach:                Appears normal, left
sided
Cerebellum:            Appears normal         Abdomen:                Appears normal
Posterior Fossa:       Appears normal         Abdominal Wall:         Appears nml (cord
insert, abd wall)
Nuchal Fold:           Not well visualized    Cord Vessels:           Appears normal (3
vessel cord)
Face:                  Appears normal         Kidneys:                Appear normal
(orbits and profile)
Lips:                  Appears normal         Bladder:                Appears normal
Thoracic:              Appears normal         Spine:                  Appears normal
Heart:                 Not well visualized    Upper Extremities:      Appears normal
RVOT:                  Appears normal         Lower Extremities:      Appears normal
LVOT:                  Appears normal

Other:  Male gender. 5th digit visualized. Rt heel visualized. Nasal bone
visualized. Technically difficult due to maternal habitus and fetal
position.
Cervix Uterus Adnexa

Cervix
Length:            3.2  cm.
Normal appearance by transabdominal scan.

Uterus
No abnormality visualized.
Impression

Single living intrauterine pregnancy at 19 weeks 0 days.
Appropriate fetal growth (48%).
Normal amniotic fluid volume.
The fetal anatomic survey is not complete.
No gross fetal anomalies identified.
Recommendations

Recommend follow-up ultrasound examination in 6 weeks to
reassess fetal growth and anatomy.

## 2019-09-04 ENCOUNTER — Ambulatory Visit: Payer: Medicare Other | Attending: Internal Medicine

## 2019-09-04 DIAGNOSIS — Z23 Encounter for immunization: Secondary | ICD-10-CM

## 2019-09-04 NOTE — Progress Notes (Signed)
   Covid-19 Vaccination Clinic  Name:  Kim Brewer    MRN: [id number] DOB: 07/06/1985  09/04/2019  Kim Brewer was observed post Covid-19 immunization for 15 minutes without incident. She was provided with Vaccine Information Sheet and instruction to access the V-Safe system.   Kim Brewer was instructed to call 911 with any severe reactions post vaccine: Marland Kitchen Difficulty breathing  . Swelling of face and throat  . A fast heartbeat  . A bad rash all over body  . Dizziness and weakness   Immunizations Administered    Name Date Dose VIS Date Route   Pfizer COVID-19 Vaccine 09/04/2019 11:26 AM 0.3 mL 05/26/2019 Intramuscular   Manufacturer: [employer], [employer]   Lot: [id number]   NDC: [address]-5

## 2019-09-26 ENCOUNTER — Ambulatory Visit: Payer: Self-pay

## 2019-09-29 ENCOUNTER — Ambulatory Visit: Payer: Medicare Other | Attending: Internal Medicine

## 2019-09-29 DIAGNOSIS — Z23 Encounter for immunization: Secondary | ICD-10-CM

## 2019-09-29 NOTE — Progress Notes (Signed)
   Covid-19 Vaccination Clinic  Name:  Kim Brewer    MRN: [id number] DOB: 1986-03-19  09/29/2019  [name] was observed post Covid-19 immunization for 15 minutes without incident. She was provided with Vaccine Information Sheet and instruction to access the V-Safe system.   [name] was instructed to call 911 with any severe reactions post vaccine: Marland Kitchen Difficulty breathing  . Swelling of face and throat  . A fast heartbeat  . A bad rash all over body  . Dizziness and weakness   Immunizations Administered    Name Date Dose VIS Date Route   Pfizer COVID-19 Vaccine 09/29/2019  4:38 PM 0.3 mL 05/26/2019 Intramuscular   Manufacturer: [employer], [employer]   Lot: [id number]   NDC: [address]-7

## 2019-10-02 ENCOUNTER — Ambulatory Visit: Payer: Self-pay
